# Patient Record
Sex: Female | Born: 1955 | Race: White | Hispanic: No | Marital: Married | Smoking: Never smoker
Health system: Southern US, Community
[De-identification: ages and names within clinical notes are randomized; demographics above are authoritative.]

## PROBLEM LIST (undated history)

## (undated) DIAGNOSIS — K3189 Other diseases of stomach and duodenum: Secondary | ICD-10-CM

## (undated) DIAGNOSIS — Q8901 Asplenia (congenital): Secondary | ICD-10-CM

## (undated) DIAGNOSIS — F32A Depression, unspecified: Secondary | ICD-10-CM

## (undated) DIAGNOSIS — K743 Primary biliary cirrhosis: Secondary | ICD-10-CM

## (undated) DIAGNOSIS — F329 Major depressive disorder, single episode, unspecified: Secondary | ICD-10-CM

## (undated) DIAGNOSIS — R011 Cardiac murmur, unspecified: Secondary | ICD-10-CM

## (undated) DIAGNOSIS — IMO0002 Reserved for concepts with insufficient information to code with codable children: Secondary | ICD-10-CM

## (undated) DIAGNOSIS — M329 Systemic lupus erythematosus, unspecified: Secondary | ICD-10-CM

## (undated) DIAGNOSIS — K766 Portal hypertension: Secondary | ICD-10-CM

## (undated) HISTORY — DX: Asplenia (congenital): Q89.01

## (undated) HISTORY — DX: Reserved for concepts with insufficient information to code with codable children: IMO0002

## (undated) HISTORY — PX: OTHER SURGICAL HISTORY: SHX169

## (undated) HISTORY — DX: Portal hypertension: K76.6

## (undated) HISTORY — DX: Other diseases of stomach and duodenum: K31.89

## (undated) HISTORY — DX: Depression, unspecified: F32.A

## (undated) HISTORY — DX: Systemic lupus erythematosus, unspecified: M32.9

## (undated) HISTORY — DX: Cardiac murmur, unspecified: R01.1

## (undated) HISTORY — DX: Major depressive disorder, single episode, unspecified: F32.9

---

## 2003-01-27 ENCOUNTER — Other Ambulatory Visit: Admission: RE | Admit: 2003-01-27 | Discharge: 2003-01-27 | Payer: Self-pay | Admitting: Obstetrics and Gynecology

## 2003-06-26 ENCOUNTER — Ambulatory Visit (HOSPITAL_COMMUNITY): Admission: RE | Admit: 2003-06-26 | Discharge: 2003-06-26 | Payer: Self-pay | Admitting: Urology

## 2003-06-26 ENCOUNTER — Ambulatory Visit (HOSPITAL_BASED_OUTPATIENT_CLINIC_OR_DEPARTMENT_OTHER): Admission: RE | Admit: 2003-06-26 | Discharge: 2003-06-26 | Payer: Self-pay | Admitting: Urology

## 2003-06-26 ENCOUNTER — Encounter (INDEPENDENT_AMBULATORY_CARE_PROVIDER_SITE_OTHER): Payer: Self-pay | Admitting: Specialist

## 2005-10-20 HISTORY — PX: MANDIBLE SURGERY: SHX707

## 2006-09-01 ENCOUNTER — Ambulatory Visit: Payer: Self-pay | Admitting: Family Medicine

## 2006-09-01 DIAGNOSIS — F3289 Other specified depressive episodes: Secondary | ICD-10-CM | POA: Insufficient documentation

## 2006-09-01 DIAGNOSIS — F329 Major depressive disorder, single episode, unspecified: Secondary | ICD-10-CM

## 2006-09-23 ENCOUNTER — Ambulatory Visit (HOSPITAL_COMMUNITY): Admission: RE | Admit: 2006-09-23 | Discharge: 2006-09-23 | Payer: Self-pay | Admitting: Obstetrics and Gynecology

## 2006-12-24 ENCOUNTER — Telehealth (INDEPENDENT_AMBULATORY_CARE_PROVIDER_SITE_OTHER): Payer: Self-pay | Admitting: *Deleted

## 2007-05-04 ENCOUNTER — Encounter: Payer: Self-pay | Admitting: Family Medicine

## 2008-04-27 ENCOUNTER — Ambulatory Visit: Payer: Self-pay | Admitting: Family Medicine

## 2008-04-27 DIAGNOSIS — J029 Acute pharyngitis, unspecified: Secondary | ICD-10-CM

## 2008-05-01 ENCOUNTER — Encounter (INDEPENDENT_AMBULATORY_CARE_PROVIDER_SITE_OTHER): Payer: Self-pay | Admitting: *Deleted

## 2008-07-31 ENCOUNTER — Ambulatory Visit (HOSPITAL_COMMUNITY): Admission: RE | Admit: 2008-07-31 | Discharge: 2008-07-31 | Payer: Self-pay | Admitting: Obstetrics and Gynecology

## 2008-07-31 ENCOUNTER — Encounter (INDEPENDENT_AMBULATORY_CARE_PROVIDER_SITE_OTHER): Payer: Self-pay | Admitting: Obstetrics and Gynecology

## 2008-10-30 ENCOUNTER — Ambulatory Visit (HOSPITAL_BASED_OUTPATIENT_CLINIC_OR_DEPARTMENT_OTHER): Admission: RE | Admit: 2008-10-30 | Discharge: 2008-10-30 | Payer: Self-pay | Admitting: Obstetrics and Gynecology

## 2008-10-30 ENCOUNTER — Ambulatory Visit: Payer: Self-pay | Admitting: Diagnostic Radiology

## 2008-11-24 ENCOUNTER — Ambulatory Visit: Payer: Self-pay | Admitting: Family Medicine

## 2008-11-24 DIAGNOSIS — R609 Edema, unspecified: Secondary | ICD-10-CM | POA: Insufficient documentation

## 2008-11-24 DIAGNOSIS — R42 Dizziness and giddiness: Secondary | ICD-10-CM | POA: Insufficient documentation

## 2008-11-24 DIAGNOSIS — M2550 Pain in unspecified joint: Secondary | ICD-10-CM | POA: Insufficient documentation

## 2008-12-01 ENCOUNTER — Telehealth (INDEPENDENT_AMBULATORY_CARE_PROVIDER_SITE_OTHER): Payer: Self-pay | Admitting: *Deleted

## 2008-12-08 ENCOUNTER — Encounter: Payer: Self-pay | Admitting: Family Medicine

## 2008-12-18 ENCOUNTER — Encounter: Payer: Self-pay | Admitting: Family Medicine

## 2009-01-01 ENCOUNTER — Encounter: Admission: RE | Admit: 2009-01-01 | Discharge: 2009-01-01 | Payer: Self-pay | Admitting: Rheumatology

## 2009-01-01 ENCOUNTER — Encounter: Payer: Self-pay | Admitting: Family Medicine

## 2009-01-10 ENCOUNTER — Telehealth (INDEPENDENT_AMBULATORY_CARE_PROVIDER_SITE_OTHER): Payer: Self-pay | Admitting: *Deleted

## 2009-01-17 ENCOUNTER — Ambulatory Visit: Payer: Self-pay | Admitting: Family Medicine

## 2009-01-17 ENCOUNTER — Telehealth: Payer: Self-pay | Admitting: Family Medicine

## 2009-01-17 DIAGNOSIS — R740 Nonspecific elevation of levels of transaminase and lactic acid dehydrogenase [LDH]: Secondary | ICD-10-CM

## 2009-01-22 ENCOUNTER — Encounter (INDEPENDENT_AMBULATORY_CARE_PROVIDER_SITE_OTHER): Payer: Self-pay | Admitting: *Deleted

## 2009-01-22 LAB — CONVERTED CEMR LAB
Bilirubin, Direct: 0.6 mg/dL — ABNORMAL HIGH (ref 0.0–0.3)
HCV Ab: NEGATIVE
Hep A IgM: NEGATIVE
Hep B C IgM: NEGATIVE
Indirect Bilirubin: 0.8 mg/dL (ref 0.0–0.9)

## 2009-01-25 ENCOUNTER — Ambulatory Visit: Payer: Self-pay | Admitting: Family Medicine

## 2009-01-25 DIAGNOSIS — R143 Flatulence: Secondary | ICD-10-CM

## 2009-01-25 DIAGNOSIS — R142 Eructation: Secondary | ICD-10-CM

## 2009-01-25 DIAGNOSIS — R141 Gas pain: Secondary | ICD-10-CM

## 2009-01-26 ENCOUNTER — Ambulatory Visit: Payer: Self-pay | Admitting: Family Medicine

## 2009-01-29 ENCOUNTER — Encounter (INDEPENDENT_AMBULATORY_CARE_PROVIDER_SITE_OTHER): Payer: Self-pay | Admitting: *Deleted

## 2009-02-02 ENCOUNTER — Telehealth: Payer: Self-pay | Admitting: Family Medicine

## 2009-02-05 LAB — CONVERTED CEMR LAB
ALT: 22 units/L (ref 0–35)
AST: 39 units/L — ABNORMAL HIGH (ref 0–37)
Alkaline Phosphatase: 171 units/L — ABNORMAL HIGH (ref 39–117)
BUN: 5 mg/dL — ABNORMAL LOW (ref 6–23)
Chloride: 109 meq/L (ref 96–112)
GFR calc non Af Amer: 178.1 mL/min (ref >60)
Glucose, Bld: 77 mg/dL (ref 70–99)
Potassium: 4 meq/L (ref 3.5–5.1)
Sodium: 141 meq/L (ref 135–145)
TSH: 1.47 microintl units/mL (ref 0.35–5.50)
Total Bilirubin: 1.9 mg/dL — ABNORMAL HIGH (ref 0.3–1.2)

## 2009-02-08 ENCOUNTER — Telehealth: Payer: Self-pay | Admitting: Gastroenterology

## 2009-02-08 ENCOUNTER — Ambulatory Visit: Payer: Self-pay | Admitting: Gastroenterology

## 2009-02-08 DIAGNOSIS — N390 Urinary tract infection, site not specified: Secondary | ICD-10-CM | POA: Insufficient documentation

## 2009-02-08 DIAGNOSIS — D649 Anemia, unspecified: Secondary | ICD-10-CM | POA: Insufficient documentation

## 2009-02-08 DIAGNOSIS — K219 Gastro-esophageal reflux disease without esophagitis: Secondary | ICD-10-CM | POA: Insufficient documentation

## 2009-02-08 DIAGNOSIS — F411 Generalized anxiety disorder: Secondary | ICD-10-CM | POA: Insufficient documentation

## 2009-02-08 LAB — CONVERTED CEMR LAB
A-1 Antitrypsin, Ser: 153 mg/dL (ref 83–200)
Ceruloplasmin: 26 mg/dL (ref 21–63)

## 2009-02-09 LAB — CONVERTED CEMR LAB
ALT: 26 units/L (ref 0–35)
Albumin: 2.8 g/dL — ABNORMAL LOW (ref 3.5–5.2)
Alkaline Phosphatase: 186 units/L — ABNORMAL HIGH (ref 39–117)
Ammonia: 11 umol/L (ref 11–35)
Bilirubin, Direct: 0.6 mg/dL — ABNORMAL HIGH (ref 0.0–0.3)
Folate: 11.9 ng/mL
INR: 1.1 — ABNORMAL HIGH (ref 0.8–1.0)
Prothrombin Time: 11.8 s — ABNORMAL HIGH (ref 9.1–11.7)
Saturation Ratios: 30.3 % (ref 20.0–50.0)
Total Protein: 6.5 g/dL (ref 6.0–8.3)

## 2009-02-19 ENCOUNTER — Telehealth: Payer: Self-pay | Admitting: Gastroenterology

## 2009-02-19 ENCOUNTER — Encounter: Admission: RE | Admit: 2009-02-19 | Discharge: 2009-02-19 | Payer: Self-pay | Admitting: Family Medicine

## 2009-02-19 ENCOUNTER — Telehealth (INDEPENDENT_AMBULATORY_CARE_PROVIDER_SITE_OTHER): Payer: Self-pay | Admitting: *Deleted

## 2009-02-23 ENCOUNTER — Ambulatory Visit: Payer: Self-pay | Admitting: Gastroenterology

## 2009-02-26 ENCOUNTER — Telehealth (INDEPENDENT_AMBULATORY_CARE_PROVIDER_SITE_OTHER): Payer: Self-pay | Admitting: *Deleted

## 2009-03-12 ENCOUNTER — Ambulatory Visit (HOSPITAL_COMMUNITY): Admission: RE | Admit: 2009-03-12 | Discharge: 2009-03-12 | Payer: Self-pay | Admitting: Gastroenterology

## 2009-03-12 ENCOUNTER — Encounter: Payer: Self-pay | Admitting: Gastroenterology

## 2009-03-15 ENCOUNTER — Telehealth: Payer: Self-pay | Admitting: Gastroenterology

## 2009-03-16 ENCOUNTER — Telehealth: Payer: Self-pay | Admitting: Gastroenterology

## 2009-03-19 ENCOUNTER — Telehealth: Payer: Self-pay | Admitting: Gastroenterology

## 2009-03-20 ENCOUNTER — Telehealth: Payer: Self-pay | Admitting: Family Medicine

## 2009-03-20 DIAGNOSIS — R404 Transient alteration of awareness: Secondary | ICD-10-CM

## 2009-03-21 ENCOUNTER — Encounter (INDEPENDENT_AMBULATORY_CARE_PROVIDER_SITE_OTHER): Payer: Self-pay | Admitting: *Deleted

## 2009-03-23 ENCOUNTER — Ambulatory Visit: Payer: Self-pay | Admitting: Gastroenterology

## 2009-03-23 DIAGNOSIS — K738 Other chronic hepatitis, not elsewhere classified: Secondary | ICD-10-CM

## 2009-03-23 LAB — CONVERTED CEMR LAB
Alpha-1-Globulin: 4.1 % (ref 2.9–4.9)
Alpha-2-Globulin: 6 % — ABNORMAL LOW (ref 7.1–11.8)
Beta Globulin: 6.4 % (ref 4.7–7.2)
Gamma Globulin: 28.2 % — ABNORMAL HIGH (ref 11.1–18.8)

## 2009-03-27 ENCOUNTER — Telehealth: Payer: Self-pay | Admitting: Gastroenterology

## 2009-03-27 LAB — CONVERTED CEMR LAB
ALT: 35 units/L (ref 0–35)
Albumin: 2.9 g/dL — ABNORMAL LOW (ref 3.5–5.2)
Ammonia: 25 umol/L (ref 11–35)
INR: 1.1 — ABNORMAL HIGH (ref 0.8–1.0)
Prothrombin Time: 11.7 s (ref 9.1–11.7)
Total Protein: 6.5 g/dL (ref 6.0–8.3)

## 2009-05-14 ENCOUNTER — Encounter: Payer: Self-pay | Admitting: Family Medicine

## 2009-05-15 ENCOUNTER — Telehealth: Payer: Self-pay | Admitting: Gastroenterology

## 2009-06-25 ENCOUNTER — Encounter: Payer: Self-pay | Admitting: Family Medicine

## 2009-07-27 ENCOUNTER — Encounter: Payer: Self-pay | Admitting: Family Medicine

## 2009-08-01 ENCOUNTER — Ambulatory Visit: Payer: Self-pay | Admitting: Family Medicine

## 2009-08-27 ENCOUNTER — Ambulatory Visit: Payer: Self-pay | Admitting: Family Medicine

## 2009-08-27 DIAGNOSIS — L259 Unspecified contact dermatitis, unspecified cause: Secondary | ICD-10-CM | POA: Insufficient documentation

## 2009-08-27 DIAGNOSIS — Q8909 Congenital malformations of spleen: Secondary | ICD-10-CM

## 2009-11-02 ENCOUNTER — Ambulatory Visit: Payer: Self-pay | Admitting: Family Medicine

## 2009-11-02 DIAGNOSIS — R21 Rash and other nonspecific skin eruption: Secondary | ICD-10-CM

## 2009-11-06 ENCOUNTER — Encounter: Payer: Self-pay | Admitting: Family Medicine

## 2009-11-09 ENCOUNTER — Telehealth: Payer: Self-pay | Admitting: Gastroenterology

## 2009-11-21 ENCOUNTER — Ambulatory Visit (HOSPITAL_BASED_OUTPATIENT_CLINIC_OR_DEPARTMENT_OTHER): Admission: RE | Admit: 2009-11-21 | Discharge: 2009-11-21 | Payer: Self-pay | Admitting: Obstetrics and Gynecology

## 2009-11-21 ENCOUNTER — Ambulatory Visit: Payer: Self-pay | Admitting: Diagnostic Radiology

## 2009-12-07 ENCOUNTER — Encounter: Payer: Self-pay | Admitting: Gastroenterology

## 2009-12-24 ENCOUNTER — Ambulatory Visit: Payer: Self-pay | Admitting: Family Medicine

## 2010-01-15 ENCOUNTER — Telehealth: Payer: Self-pay | Admitting: Gastroenterology

## 2010-01-18 ENCOUNTER — Telehealth (INDEPENDENT_AMBULATORY_CARE_PROVIDER_SITE_OTHER): Payer: Self-pay | Admitting: *Deleted

## 2010-01-18 ENCOUNTER — Telehealth: Payer: Self-pay | Admitting: Internal Medicine

## 2010-02-10 ENCOUNTER — Encounter: Payer: Self-pay | Admitting: Obstetrics and Gynecology

## 2010-02-17 LAB — CONVERTED CEMR LAB
ALT: 25 units/L (ref 0–35)
ALT: 35 units/L (ref 0–35)
AST: 42 units/L — ABNORMAL HIGH (ref 0–37)
Albumin: 3 g/dL — ABNORMAL LOW (ref 3.5–5.2)
BUN: 5 mg/dL — ABNORMAL LOW (ref 6–23)
BUN: 6 mg/dL (ref 6–23)
Basophils Relative: 0.4 % (ref 0.0–3.0)
Bilirubin, Direct: 0.1 mg/dL (ref 0.0–0.3)
Calcium: 8.8 mg/dL (ref 8.4–10.5)
Chloride: 106 meq/L (ref 96–112)
Cholesterol: 159 mg/dL (ref 0–200)
Eosinophils Absolute: 0.2 10*3/uL (ref 0.0–0.6)
Eosinophils Relative: 2.9 % (ref 0.0–5.0)
GFR calc Af Amer: 114 mL/min
GFR calc non Af Amer: 95 mL/min
Glucose, Bld: 90 mg/dL (ref 70–99)
HCT: 33.2 % — ABNORMAL LOW (ref 36.0–46.0)
HDL: 79.8 mg/dL (ref >39.0)
Hemoglobin: 11.3 g/dL — ABNORMAL LOW (ref 12.0–15.0)
Lymphocytes Relative: 35.6 % (ref 12.0–46.0)
Lymphs Abs: 4.7 10*3/uL — ABNORMAL HIGH (ref 0.7–4.0)
MCV: 94.5 fL (ref 78.0–100.0)
MCV: 98.8 fL (ref 78.0–100.0)
Monocytes Absolute: 1.4 10*3/uL — ABNORMAL HIGH (ref 0.1–1.0)
Monocytes Relative: 14.8 % — ABNORMAL HIGH (ref 3.0–12.0)
Monocytes Relative: 9.6 % (ref 3.0–11.0)
Neutro Abs: 2.8 10*3/uL (ref 1.4–7.7)
Neutro Abs: 4.4 10*3/uL (ref 1.4–7.7)
Pap Smear: NORMAL
Platelets: 138 10*3/uL — ABNORMAL LOW (ref 150.0–400.0)
Platelets: 368 10*3/uL (ref 150–400)
Potassium: 4.2 meq/L (ref 3.5–5.1)
Potassium: 4.5 meq/L (ref 3.5–5.1)
RBC: 3.36 M/uL — ABNORMAL LOW (ref 3.87–5.11)
Rhuematoid fact SerPl-aCnc: <20 intl units/mL (ref 0.0–20.0)
Sodium: 143 meq/L (ref 135–145)
TSH: 1.06 microintl units/mL (ref 0.35–5.50)
Total Protein: 6.7 g/dL (ref 6.0–8.3)
Triglycerides: 61 mg/dL (ref 0–149)
WBC: 9.2 10*3/uL (ref 4.5–10.5)

## 2010-02-19 NOTE — Letter (Signed)
Summary: Hep A & B Vaccine Orders/[facility]  Hep A & B Vaccine Orders/[facility]   Imported By: Lanelle Bal 08/08/2009 11:05:29  _____________________________________________________________________  External Attachment:    Type:   Image     Comment:   External Document

## 2010-02-19 NOTE — Progress Notes (Signed)
Summary: Received Medical Release  Received a completed Medical release for Dr. Loreta Ave to receive records. [employer] faxed and mailed 15 pages. Dena Chavis  February 26, 2009 2:54 PM

## 2010-02-19 NOTE — Progress Notes (Signed)
Summary: Needs a new prescription  Phone Note Call from Patient Call back at Home Phone [phone]   Call For: Dr Jarold Motto Summary of Call: Prescription wasa changed from 3 to 6 pills per day for Aldactone. Is all out of her meds. [employer]. Initial call taken by: Leanor Kail [facility],  November 09, 2009 9:13 AM  Follow-up for Phone Call        rx sent  Follow-up by: Harlow Mares CMA [facility]),  November 09, 2009 9:42 AM    Prescriptions: ALDACTONE 25 MG TABS (SPIRONOLACTONE) 2 tabs by mouth three times a day  #180 x 1   Entered by:   Harlow Mares CMA (AAMA)   Authorized by:   Mardella Layman MD [facility]   Signed by:   Harlow Mares CMA (AAMA) on 11/09/2009   Method used:   Electronically to        [facility]* (retail)       [address]       [place], [place]  [address]       Ph: [phone]       Fax: [phone]   RxID:   [id number]

## 2010-02-19 NOTE — Progress Notes (Signed)
Summary: fyi fyi  leg swelling  Phone Note Call from Patient Call back at Home Phone [phone]   Caller: Patient Summary of Call: pt left VM that she would like results of  recent labs.called pt back advise pt labs normal other labs pending. pt states that she is still worried about the swelling in her leg. pt states that she spoke with a co-worker who had swelling in her leg that turn out to be a clot, so pt is now very concern about this matter. However pt main concern was that it was not what was initially stated at OV because it is no better. pt denies any tenderness to touch or warmness. dr Laury Axon pls advise...............Marland KitchenFelecia Deloach CMA  February 02, 2009 3:57 PM   Follow-up for Phone Call        If she is concerned about a clot she should go to ER--- labs have not changed much we will  make sure GI gets them.   Is swelling worse than when she was here last?   Follow-up by: Loreen Freud DO,  February 02, 2009 5:22 PM  Additional Follow-up for Phone Call Additional follow up Details #1::        left pt detail message if she believe it is a clot she needs to be seen in ED. will f/u with pt on monday..................Marland KitchenFelecia Deloach CMA  February 02, 2009 5:25 PM     Additional Follow-up for Phone Call Additional follow up Details #2::    pt aware, decline ED pt states that she just wanted to know what other options she has to rule out a clot. pt states that she would prefer to have a ultrasound done first to make sure it is not a clot. pt denies any change in swelling since last OV, pain or warmness to touch. pt states that if there is any change in legs she will go to ED..................Marland KitchenFelecia Deloach CMA  February 05, 2009 8:20 AM   Additional Follow-up for Phone Call Additional follow up Details #3:: Details for Additional Follow-up Action Taken: Usually if no pain --there is no clot---US ordered but it she develops any reddness or pain go to ER Does she still have abd  bloating?  If yes---also get pelvic US  Pt states that she is having abdomen bloating. She agreed to go to the ER if she was having any redness or pain. Army Fossa CMA  February 05, 2009 9:47 AM Additional Follow-up by: Loreen Freud DO,  February 05, 2009 8:52 AM

## 2010-02-19 NOTE — Assessment & Plan Note (Signed)
Summary: rash on chest again/kn   Vital Signs:  Patient profile:   55 year old female Height:      64 inches (162.56 cm) Weight:      147.25 pounds (66.93 kg) BMI:     25.37 Temp:     98.2 degrees F (36.78 degrees C) oral BP sitting:   120 / 62  (left arm) Cuff size:   regular  Vitals Entered By: Lucious Groves CMA (November 02, 2009 3:00 PM) CC: C/O recurrent rash on chest./kb Is Patient Diabetic? No Pain Assessment Patient in pain? no      Comments Patient notes that it is not painful, nor has she had fever or any lifestyle/diet changes. Patient continues to itch and scratch which is turning rash areas into sores./kb   History of Present Illness: Pt here c/o rash is back---see last visit.   + itchy.  Current Medications (verified): 1)  Aldactone 25 Mg Tabs (Spironolactone) .... 2 Tabs By Mouth Three Times A Day 2)  Dextroamphetamine Sulfate 10 Mg Tabs (Dextroamphetamine Sulfate) .... Take 2 Tabs in Morning and Noon 3)  Diprolene 0.05 % Lotn (Betamethasone Dipropionate Aug) .... Apply Bid  Allergies (verified): 1)  ! Pcn  Past History:  Past medical, surgical, family and social histories (including risk factors) reviewed for relevance to current acute and chronic problems.  Past Medical History: Reviewed history from 09/01/2006 and no changes required. Depression  Past Surgical History: Reviewed history from 09/01/2006 and no changes required. UPPER/LOWER JAW REPAIR  10/07  Family History: Reviewed history from 02/23/2009 and no changes required. MOTHER:LIVING FATHER:DECEASED 3 BROTHERS:LIVING Family History Hypertension: M, F HEART: FATHER'S SIDE OF FAMILY Family History Diabetes 1st degree relative: BRAIN CANCER: MGF OTHER CANCER:PGF Family History Depression:BROTHER Family History of Ovarian Cancer:MGM Family History of Stomach Cancer:PGM  Family History of Uterine Cancer:Mother No FH of Colon Cancer:  Social History: Reviewed history from 02/08/2009  and no changes required. 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     Impression & Recommendations:  Problem # 1:  SKIN RASH (ICD-782.1)  ? psoriasis Her updated medication list for this problem includes:    Diprolene 0.05 % Lotn (Betamethasone dipropionate aug) .Marland Kitchen... Apply bid  Orders: Dermatology Referral [facility])  Discussed medication use and symptom control.   Complete Medication List: 1)  Aldactone 25 Mg Tabs (Spironolactone) .... 2 tabs by mouth three times a day 2)  Dextroamphetamine Sulfate 10 Mg Tabs (Dextroamphetamine sulfate) .... Take 2 tabs in morning and noon 3)  Diprolene 0.05 % Lotn (Betamethasone dipropionate aug) .... Apply bid Prescriptions: DIPROLENE 0.05 % LOTN (BETAMETHASONE DIPROPIONATE AUG) apply bid  #30 g x 0   Entered and Authorized by:   Loreen Freud DO   Signed by:   Loreen Freud DO on 11/02/2009   Method used:   Electronically to        [facility]* (retail)       [address]       [place], [place]  [address]       Ph: [phone]       Fax: [phone]  RxID:   [id number]

## 2010-02-19 NOTE — Assessment & Plan Note (Signed)
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 She previously had a very weakly positive ANA but all other serologic markers have been negative. She denies any symptoms of collagen vascular disease.  She is severely depressed has been off of her Celexa and Abilify or herpetic workup. Her husband is with her who is very critical of all physicians. They're convinced that she needs cardiology referral because of her edema which I think is related to her low albumin levels. Surprisingly, liver biopsy showed no evidence of cirrhosis or active inflammation. She denies any other mental status or neurological problems. She denies any cardiovascular or pulmonary complaints, bleeding problems, other gastrointestinal issues, fever chills or other systemic complaints.   GI Review of Systems    Reports nausea.      Denies abdominal pain, acid reflux, belching, bloating, chest pain, dysphagia with liquids, dysphagia with solids, heartburn, loss of appetite, vomiting, vomiting blood, weight loss, and  weight gain.      Reports liver problems.     Denies anal fissure, black tarry stools, change in bowel habit, constipation, diarrhea, diverticulosis, fecal incontinence, heme positive stool, hemorrhoids, irritable bowel syndrome, jaundice, light color stool, rectal bleeding, and  rectal pain.    Current Medications (verified): 1)  Aldactone 25 Mg Tabs (Spironolactone) .Marland Kitchen.. 1 By Mouth Tid 2)  Aciphex 20 Mg Tbec (Rabeprazole Sodium) .Marland Kitchen.. 1 By Mouth Qd  Allergies (verified): 1)  ! Pcn  Past History:  Past medical, surgical, family  and social histories (including risk factors) reviewed for relevance to current acute and chronic problems.  Past Medical History: Reviewed history from 09/01/2006 and no changes required. Depression  Past Surgical History: Reviewed history from 09/01/2006 and no changes required. UPPER/LOWER JAW REPAIR  10/07  Family History: Reviewed history from 02/23/2009 and no changes required. MOTHER:LIVING FATHER:DECEASED 3 BROTHERS:LIVING Family History Hypertension: M, F HEART: FATHER'S SIDE OF FAMILY Family History Diabetes 1st degree relative: BRAIN CANCER: MGF OTHER CANCER:PGF Family History Depression:BROTHER Family History of Ovarian Cancer:MGM Family History of Stomach Cancer:PGM  Family History of Uterine Cancer:Mother No FH of Colon Cancer:  Social History: Reviewed history from 02/08/2009 and no changes required. 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  The patient denies allergy/sinus, anemia, anxiety-new, arthritis/joint pain, back pain, blood in urine, breast changes/lumps, change in vision, confusion, cough, coughing up blood, fainting, fever, headaches-new, hearing problems, heart murmur, heart rhythm changes, itching, menstrual pain, muscle pains/cramps, night sweats, nosebleeds, pregnancy symptoms, shortness of breath, skin rash, sore throat, swollen lymph glands, thirst - excessive , urination - excessive , urination changes/pain, urine leakage, vision changes, and voice change.         She complains of worsening edema especially at night but no shortness of breath, dyspnea, chest pain, et Karie Soda. She has had thorough gynecologic workup has been no evidence of malignancy or gynecologic problems.  Vital Signs:  Patient profile:   55 year old  female Height:  64 inches Weight:      151.50 pounds BMI:     26.10 Pulse rate:   70 / minute Pulse rhythm:   regular BP sitting:   100 / 50  (left arm)  Vitals Entered By: Chales Abrahams CMA [facility]) (March 23, 2009 8:45 AM)  Physical Exam  General:  Well developed, well nourished, no acute distress.healthy appearing.   Head:  Normocephalic and atraumatic. Eyes:  PERRLA, no icterus.exam deferred to patient's ophthalmologist.   Abdomen:  Soft, nontender and nondistended. No masses, hepatosplenomegaly or hernias noted. Normal bowel sounds. Extremities:  1+ pedal edema.   Neurologic:  Alert and  oriented x4;  grossly normal neurologically. Psych:  depressed affect.     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 I will check serum protein electrophoresis, repeat liver profile, ammonia level, and prothrombin time. Cause of her edema and increased her Aldactone to 50 mg t.i.d. Her husband has a variety of vitamin supplements and" boosters" that I have asked him to discontinue since some of these do contain excessive amounts of vitamin A. I've suggested that she take a Centrum Silver multivitamin daily. She allegedly has stopped using alcohol altogether, and her previous CDT level was borderline elevated. I probably will refer her to Dr.Jarma Piedad Climes at [facility] hepatology Department for second opinion. There seems to be a disconnect here between her symptomatology and her hepatic findings. Orders: TLB-Hepatic/Liver Function Pnl (80076-HEPATIC) TLB-Ammonia ([id number]) TLB-PT (Protime) (85610-PTP) T-ANA ([id number]) T-Serum Protein Electrophoresis ([id number])  Problem # 2:  SOMNOLENCE  (ICD-780.09) Assessment: Unchanged This to me seems to be most related to her depression which has appreciably worsened in the last several months off of medication.  Patient Instructions: 1)  Go to the basement for lab work. 2)  Increase your Aldactone to 2 tabs three times a day. 3)  The medication list was reviewed and reconciled.  All changed / newly prescribed medications were explained.  A complete medication list was provided to the patient / caregiver. 4)  Call Dr. Evelene Croon to resume antidepressant medications. 5)  Copy sent to : Dr. Amedeo Gory and Dr. Evelene Croon in psychiatry

## 2010-02-19 NOTE — Progress Notes (Signed)
Summary: Question for nurse  Phone Note Call from Patient Call back at Home Phone [phone]   Call For: DR PATTERSON Summary of Call: Was here today for office visit and has a question for nurse. Initial call taken by: Leanor Kail [facility],  February 08, 2009 10:41 AM  Follow-up for Phone Call        Left message for pt to call back. Ashok Cordia RN  February 08, 2009 10:47 AM  talked with pt.  She asks what condition Dr. Jarold Motto mentioned that could be her problem.   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 Follow-up by: Ashok Cordia RN,  February 08, 2009 1:05 PM

## 2010-02-19 NOTE — Progress Notes (Signed)
Summary: Wants labs faxed  Phone Note Call from Patient Call back at [phone]   Call For: Dr Jarold Motto Summary of Call: [phone] FAX# would like all her labs done so far. Initial call taken by: Leanor Kail [facility],  March 19, 2009 8:55 AM  Follow-up for Phone Call        Talked with pt.  Copies of labs sent to pt.  Follow-up by: Ashok Cordia RN,  March 19, 2009 9:14 AM

## 2010-02-19 NOTE — Consult Note (Signed)
Summary: [facility]   Imported By: Lanelle Bal 12/19/2009 13:07:23  _____________________________________________________________________  External Attachment:    Type:   Image     Comment:   External Document

## 2010-02-19 NOTE — Progress Notes (Signed)
Summary: Triage  Phone Note Call from Patient Call back at Home Phone [phone]   Caller: Patient Call For: Dr. Jarold Motto Reason for Call: Talk to Nurse Summary of Call: Pt is using Gelniquie and she thinks the problems she is having are coming from the side effects of this medication Initial call taken by: Karna Christmas,  February 19, 2009 12:27 PM  Follow-up for Phone Call        Pt forgot to tell Dr. Jarold Motto at last OV that she has been using Gelnique, which is Oxybutynin in a jell form that is used for overactive bladder.  Pt read that the side effects are elevated liver enzyme and swelling.  She has not used this in 2 weeks.  Meds given at OV have helped.  Just wanted Dr. Jarold Motto to know.  Follow-up by: Ashok Cordia RN,  February 19, 2009 12:49 PM  Additional Follow-up for Phone Call Additional follow up Details #1::        ok Additional Follow-up by: Mardella Layman MD [facility],  February 19, 2009 1:14 PM

## 2010-02-19 NOTE — Letter (Signed)
Summary: Primary Care Consult Scheduled Letter  [facility] [place]  [address] [place], [place] [address]   Phone: [phone]  Fax: [phone]      03/21/2009 MRN: [id number]  Sara Grimes [address] [place], [place]  [address]    Dear Ms. [place],    We have scheduled an appointment for you.  At the recommendation of Dr. Loreen Freud, we have scheduled you a Sleep Consult with Dr. Vassie Loll with [facility] on 04-10-2009 at 1:30pm.  Their address is [address] [place], [place] [address]. The office phone number is [phone].  If this appointment day and time is not convenient for you, please feel free to call the office of the doctor you are being referred to at the number listed above and reschedule the appointment.    It is important for you to keep your scheduled appointments. We are here to make sure you are given good patient care.   Thank you,    Renee, Patient Care Coordinator [facility]

## 2010-02-19 NOTE — Assessment & Plan Note (Signed)
Summary: 2wk fu./yesi   History of Present Illness Visit Type: follow up  Primary GI MD: Sheryn Bison MD FACP FAGA Primary Provider: Loreen Freud, DO  Requesting Provider: n/a Chief Complaint: F/u for severe swelling. Pt states that the medication is working well and denies any GI complaints History of Present Illness:   Sara Grimes is asymptomatic in terms of any GI complaints except for chronic fatigue. Her husband reiterates this is been a problem for several months. All of her liver workup parameters have been negative to date. Also extensive GYN evaluation has been negative for any ovarian malignancy. See CDT level was borderline but not elevated. She is on AcipHex 20 mg a day and Aldactone 25 mg t.i.d. She denies any neuropsychiatric problems, fever, chills, or systemic complaints. Her appetite is good weight is stable.   GI Review of Systems      Denies abdominal pain, acid reflux, belching, bloating, chest pain, dysphagia with liquids, dysphagia with solids, heartburn, loss of appetite, nausea, vomiting, vomiting blood, weight loss, and  weight gain.        Denies anal fissure, black tarry stools, change in bowel habit, constipation, diarrhea, diverticulosis, fecal incontinence, heme positive stool, hemorrhoids, irritable bowel syndrome, jaundice, light color stool, liver problems, rectal bleeding, and  rectal pain.    Current Medications (verified): 1)  Aldactone 25 Mg Tabs (Spironolactone) .Marland Kitchen.. 1 By Mouth Tid 2)  Aciphex 20 Mg Tbec (Rabeprazole Sodium) .Marland Kitchen.. 1 By Mouth Qd  Allergies (verified): 1)  ! Pcn  Past History:  Past medical, surgical, family and social histories (including risk factors) reviewed for relevance to current acute and chronic problems.  Past Medical History: Reviewed history from 09/01/2006 and no changes required. Depression  Past Surgical History: Reviewed history from 09/01/2006 and no changes required. UPPER/LOWER JAW REPAIR  10/07  Family  History: Reviewed history from 02/08/2009 and no changes required. MOTHER:LIVING FATHER:DECEASED 3 BROTHERS:LIVING Family History Hypertension: M, F HEART: FATHER'S SIDE OF FAMILY Family History Diabetes 1st degree relative: BRAIN CANCER: MGF OTHER CANCER:PGF Family History Depression:BROTHER Family History of Ovarian Cancer:MGM Family History of Stomach Cancer:PGM  Family History of Uterine Cancer:Mother No FH of Colon Cancer:  Social History: Reviewed history from 02/08/2009 and no changes required. 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  The patient denies allergy/sinus, anemia, anxiety-new, arthritis/joint pain, back pain, blood in urine, breast changes/lumps, change in vision, confusion, cough, coughing up blood, depression-new, fainting, fever, headaches-new, hearing problems, heart murmur, heart rhythm changes, itching, menstrual pain, muscle pains/cramps, night sweats, nosebleeds, pregnancy symptoms, shortness of breath, skin rash, sleeping problems, sore throat, swelling of feet/legs, swollen lymph glands, thirst - excessive , urination - excessive , urination changes/pain, urine leakage, vision changes, and voice change.    Vital Signs:  Patient profile:   55 year old female Height:      64 inches Weight:      153 pounds BMI:     26.36 BSA:     1.75 Pulse rate:   72 / minute Pulse rhythm:   regular BP sitting:   122 / 68  (left arm) Cuff size:   regular  Vitals Entered By: Ok Anis CMA (February 23, 2009 11:17 AM)  Physical Exam  General:  Well developed, well nourished, no acute distress.healthy appearing.   Head:  Normocephalic and atraumatic. Eyes:  PERRLA, no icterus. Psych:  Alert and cooperative. Normal mood and affect.   Impression & Recommendations:  Problem # 1:   TRANSAMINASES, SERUM, ELEVATED (ICD-790.4) Assessment Improved  Her liver enzymes have improved but her alkaline phosphatase and bilirubin remain mildly elevated. There is no definitive diagnoses determined per her suspected chronic liver disease, and ultrasound-guided liver biopsy has been scheduled with avoidance of salicylates and NSAIDs at least one week before this procedure. Further workup and therapy will depend on her liver biopsy results. For now we'll continue all medications as listed above.  Orders: CT/ULS Guided Liver Biospy (CT/ULS Guided Liv BX)  Patient Instructions: 1)  Copy sent to : Dr. Loreen Freud 2)  Please continue current medications.  3)  Liver Biopsy handout given.  4)  [facility] radiology will contact you with date and instructions re liver biopsy. 5)  You will need to stop all aspirin and ansaids 1 week prior to the biopsy. 6)  The medication list was reviewed and reconciled.  All changed / newly prescribed medications were explained.  A complete medication list was provided to the patient / caregiver.

## 2010-02-19 NOTE — Consult Note (Signed)
Summary: [facility]   Imported By: Lennie Odor 12/17/2009 13:18:00  _____________________________________________________________________  External Attachment:    Type:   Image     Comment:   External Document

## 2010-02-19 NOTE — Progress Notes (Signed)
Summary: refer cardiology  Phone Note Call from Patient Call back at Work Phone [phone] Call back at [phone]   Caller: Patient Complaint: Cough/Sore throat Summary of Call: Pt c/o severe fatigue, falling  to sleep at work and behind the wheel, and headache daily. pt stats that she feel all these symptoms are due to a lack of Oxygen. pt states that liver biopsy only showed inflammation. pt denies any chest pain, pressure, numbness, or tingling. pt would like to be referred to cardiologist. pt states that she had previously mention these symptoms to dr Laury Axon before.pls advise...........Marland KitchenFelecia Deloach CMA  March 20, 2009 2:48 PM   Follow-up for Phone Call        based on these symptoms it sounds like she need pulmonary/ sleep  not cardiology----  they do the testing to check for sleepiness during daytime.  I'll be happy to do that. Follow-up by: Loreen Freud DO,  March 20, 2009 2:52 PM  Additional Follow-up for Phone Call Additional follow up Details #1::        pt aware,awaiting appt info............Marland KitchenFelecia Deloach CMA  March 20, 2009 3:40 PM   New Problems: SOMNOLENCE (ICD-780.09)   New Problems: SOMNOLENCE (ICD-780.09)

## 2010-02-19 NOTE — Assessment & Plan Note (Signed)
Summary: HEP A/B VACC/KB  Nurse Visit   Allergies: 1)  ! Pcn  Immunizations Administered:  TwinRix # 3:    Vaccine Type: TwinRix    Site: right deltoid    Mfr: GlaxoSmithKline    Dose: 1.0 ml    Route: IM    Given by: Jeremy Johann CMA    Exp. Date: 10/27/2011    Lot #: [id number]    VIS given: 10/08/06 version given December 24, 2009.  Orders Added: 1)  TwinRix 1ml ( Hep A&B Adult dose) [90636] 2)  Admin 1st Vaccine [90471]

## 2010-02-19 NOTE — Progress Notes (Signed)
Summary: Wants labs  Phone Note Call from Patient Call back at Work Phone [phone]   Call For: Dr Jarold Motto Reason for Call: Talk to Nurse Summary of Call: Can you please fax the most recent labs to fax # [phone]. Was told to wait 2wks for [facility] to see her- wonders what is the delay. Initial call taken by: Leanor Kail [facility],  March 27, 2009 10:34 AM  Follow-up for Phone Call        Records have been faxed to [facility].  LM for pt to call. Ashok Cordia RN  March 28, 2009 9:39 AM  Spoke with pt.  She request that a copy of her last labs be faxed to her at the number given.  This has been done.  Follow-up by: Ashok Cordia RN,  March 28, 2009 10:49 AM

## 2010-02-19 NOTE — Assessment & Plan Note (Signed)
Summary: OVERALL SWELLING HAS NOT DECREASED///SPH   Vital Signs:  Patient profile:   55 year old female Height:      64 inches Weight:      166.44 pounds BMI:     28.67 Temp:     98.4 degrees F oral Pulse rate:   76 / minute Pulse rhythm:   regular BP sitting:   124 / 80  (left arm) Cuff size:   regular  Vitals Entered By: Army Fossa CMA (January 25, 2009 3:32 PM) CC: Overall swelling is not decreasing seeing GI on Jan 20th.    History of Present Illness: Pt here to f/u swelling in legs.  It no longer comes and goes it is now all the time and she feels bloating in abd as well.    Current Medications (verified): 1)  Alprazolam 0.5 Mg  Tabs (Alprazolam) .... As Needed 2)  Abilify 15 Mg  Tabs (Aripiprazole) .Marland Kitchen.. 1 Once Daily 3)  Cymbalta 60 Mg  Cpep (Duloxetine Hcl) .Marland Kitchen.. 1 Once Daily 4)  Furosemide 20 Mg Tabs (Furosemide) .Marland Kitchen.. 1 By Mouth Once Daily  Allergies: 1)  ! Pcn  Past History:  Past medical, surgical, family and social histories (including risk factors) reviewed for relevance to current acute and chronic problems.  Past Medical History: Reviewed history from 09/01/2006 and no changes required. Depression  Past Surgical History: Reviewed history from 09/01/2006 and no changes required. UPPER/LOWER JAW REPAIR  10/07  Family History: Reviewed history from 09/01/2006 and no changes required. MOTHER:LIVING FATHER:DECEASED 3 BROTHERS:LIVING Family History Hypertension: M, F HEART: FATHER'S SIDE OF FAMILY  Family History Diabetes 1st degree relative:M BRAIN CANCER: MGF OTHER CANCER:PGF  Family History Depression:BROTHER  Social History: Reviewed history from 09/01/2006 and no changes required. 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 Extremities:  2+ left pedal edema, left pretibial edema, 2+ right pedal edema, and right pretibial edema.   Neurologic:  strength normal in all extremities.   Skin:  Intact without suspicious lesions or rashes Cervical Nodes:  No lymphadenopathy noted Psych:  Cognition and judgment appear intact. Alert and cooperative with normal attention span and concentration. No apparent delusions, illusions, hallucinations   Impression & Recommendations:  Problem # 1:  PAIN IN JOINT, MULTIPLE SITES (ICD-719.49)  Problem # 2:  EDEMA (ICD-782.3)  Her updated medication list for this problem includes:    Furosemide 20 Mg Tabs (Furosemide) .Marland Kitchen... 1 by mouth once daily  Orders: Venipuncture (16109) TLB-BMP (Basic Metabolic Panel-BMET) (80048-METABOL) TLB-Hepatic/Liver Function Pnl (80076-HEPATIC) TLB-TSH (Thyroid Stimulating Hormone) (84443-TSH)  Problem # 3:  TRANSAMINASES, SERUM, ELEVATED (ICD-790.4)  Orders: Venipuncture (60454) TLB-BMP (Basic Metabolic Panel-BMET) (80048-METABOL) TLB-Hepatic/Liver Function Pnl (80076-HEPATIC) TLB-TSH (Thyroid Stimulating Hormone) (84443-TSH) TLB-BNP (B-Natriuretic Peptide) (83880-BNPR)  Problem # 4:  ABDOMINAL BLOATING (ICD-787.3) check labs consider   Complete Medication List: 1)  Alprazolam 0.5 Mg Tabs (Alprazolam) .... As needed 2)  Abilify 15 Mg Tabs (Aripiprazole) .Marland Kitchen.. 1 once daily 3)  Cymbalta 60 Mg Cpep (Duloxetine hcl) .Marland Kitchen.. 1 once daily 4)  Furosemide 20 Mg Tabs (Furosemide) .Marland Kitchen.. 1 by mouth once daily

## 2010-02-19 NOTE — Letter (Signed)
Summary: New Patient letter  [facility]  [address] [place], [place] [address]   Phone: [phone]  Fax: [phone]       01/22/2009 MRN: [id number]  SUMIRE HALBLEIB [address] [facility] [place], [place]  [address]  Dear Ms. Kaneshiro,  Welcome to the Gastroenterology Division at [facility].    You are scheduled to see Dr.  Jarold Motto on Jan. 20, 2011   at 8:30am on the 3rd floor at [facility], [address]  We ask that you try to arrive at our office 15 minutes prior to your appointment time to allow for check-in.  We would like you to complete the enclosed self-administered evaluation form prior to your visit and bring it with you on the day of your appointment.  We will review it with you.  Also, please bring a complete list of all your medications or, if you prefer, bring the medication bottles and we will list them.  Please bring your insurance card so that we may make a copy of it.  If your insurance requires a referral to see a specialist, please bring your referral form from your primary care physician.  Co-payments are due at the time of your visit and may be paid by cash, check or credit card.     Your office visit will consist of a consult with your physician (includes a physical exam), any laboratory testing he/she may order, scheduling of any necessary diagnostic testing (e.g. x-ray, ultrasound, CT-scan), and scheduling of a procedure (e.g. Endoscopy, Colonoscopy) if required.  Please allow enough time on your schedule to allow for any/all of these possibilities.    If you cannot keep your appointment, please call [phone] to cancel or reschedule prior to your appointment date.  This allows [place] the opportunity to schedule an appointment for another patient in need of care.  If you do not cancel or reschedule by 5 p.m. the business day prior to your appointment date, you will be charged a $50.00 late cancellation/no-show fee.    Thank you for  choosing [facility] for your medical needs.  We appreciate the opportunity to care for you.  Please visit [place] at our website  to learn more about our practice.                     Sincerely,                                                             The Gastroenterology Division

## 2010-02-19 NOTE — Assessment & Plan Note (Signed)
Summary: rash on chest/cbs   Vital Signs:  Patient profile:   55 year old female Height:      64 inches Weight:      147 pounds Temp:     98.3 degrees F oral Pulse rate:   72 / minute BP sitting:   118 / 70  (left arm)  Vitals Entered By: Jeremy Johann CMA (August 27, 2009 8:21 AM) CC: rash on chest, Rash   History of Present Illness:  Rash      This is a 55 year old woman who presents with Rash.  The symptoms began 1 week ago.  The patient complains of macules and papules, but denies nodules, hives, welts, pustules, blisters, ulcers, itching, scaling, weeping, oozing, redness, increased warmth, and tenderness.  The rash is located on the chest.  The rash is worse with heat and better with cold.  The patient reports a history of new topical exposure.  The patient denies history of recent tick bite, recent tick exposure, other insect bite, recent infection, recent antibiotic use, new medication, new clothing, recent travel, pet/animal contact, thyroid disease, chronic liver disease, autoimmune disease, chronic edema, and prior STD.    Current Medications (verified): 1)  Aldactone 25 Mg Tabs (Spironolactone) .... 2 Tabs By Mouth Three Times A Day 2)  Dextroamphetamine Sulfate 10 Mg Tabs (Dextroamphetamine Sulfate) .... Take 2 Tabs in Morning and Noon 3)  Prednisone 20 Mg Tabs (Prednisone) .... 3 By Mouth Once Daily For 3 Days Then 2 A Day For 3 Days Then 1 A Day For 3 Days Then 1/2 Tab For 3 Days  Allergies (verified): 1)  ! Pcn  Past History:  Past Medical History: Last updated: 09/01/2006 Depression  Past Surgical History: Last updated: 09/01/2006 UPPER/LOWER JAW REPAIR  10/07  Family History: Last updated: 26-Feb-2009 MOTHER:LIVING FATHER:DECEASED 3 BROTHERS:LIVING Family History Hypertension: M, F HEART: FATHER'S SIDE OF FAMILY Family History Diabetes 1st degree relative: BRAIN CANCER: MGF OTHER CANCER:PGF Family History Depression:BROTHER Family History of  Ovarian Cancer:MGM Family History of Stomach Cancer:PGM  Family History of Uterine Cancer:Mother No FH of Colon Cancer:  Social History: Last updated: 02/08/2009 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 Alert and cooperative with normal attention span and concentration. No apparent delusions, illusions, hallucinations   Impression & Recommendations:  Problem # 1:  CONTACT DERMATITIS&OTHER ECZEMA DUE UNSPEC CAUSE (ICD-692.9)  Her updated medication list for this problem includes:    Prednisone 20 Mg Tabs (Prednisone) .Marland KitchenMarland KitchenMarland KitchenMarland Kitchen 3 by mouth once daily for 3 days then 2 a day for 3 days then 1 a day for 3 days then 1/2 tab for 3 days antihistamine for itching  Discussed avoidance of triggers and symptomatic treatment.   Orders: Admin of Therapeutic Inj  intramuscular or subcutaneous (14782) Depo- Medrol 80mg  (J1040)  Complete Medication List: 1)  Aldactone 25 Mg Tabs (Spironolactone) .... 2 tabs by mouth three times a day 2)  Dextroamphetamine Sulfate 10 Mg Tabs (Dextroamphetamine sulfate) .... Take 2 tabs in morning and noon 3)  Prednisone 20 Mg Tabs (Prednisone) .... 3 by mouth once daily for 3 days then 2 a day for 3 days then 1 a day for 3 days then 1/2 tab for 3 days Prescriptions: PREDNISONE 20 MG TABS (PREDNISONE) 3 by mouth once daily for 3 days then 2 a day for 3 days then 1 a day for 3 days then 1/2 tab for 3 days  #21  x 0   Entered and Authorized by:   Loreen Freud DO   Signed by:   Loreen Freud DO on 08/27/2009   Method used:   Electronically to        [facility]* (retail)       [address]       [place], [place]  [address]       Ph: [phone]       Fax: [phone]   RxID:   [id number]    Medication Administration  Injection # 1:    Medication: Depo- Medrol 80mg     Diagnosis: CONTACT DERMATITIS&OTHER ECZEMA DUE UNSPEC CAUSE (ICD-692.9)    Route: IM    Site: RUOQ gluteus    Exp Date: 05/21/2010    Lot #: [id number]    Mfr: Pharmacia    Patient tolerated injection without complications    Given by: Jeremy Johann CMA (August 27, 2009 9:12 AM)  Orders Added: 1)  Est. Patient Level III [40102] 2)  Admin of Therapeutic Inj  intramuscular or subcutaneous [96372] 3)  Depo- Medrol 80mg  [J1040]

## 2010-02-19 NOTE — Progress Notes (Signed)
Summary: Biopsy Results  Phone Note Call from Patient Call back at [phone]   Call For: Dr Jarold Motto Reason for Call: Lab or Test Results Summary of Call: Liver Biopsy results. Hospital told her it only takes 3days. Initial call taken by: Leanor Kail [facility],  March 16, 2009 2:54 PM  Follow-up for Phone Call        Called pathology.  Report to be faxed.   Follow-up by: Ashok Cordia RN,  March 16, 2009 3:30 PM  Additional Follow-up for Phone Call Additional follow up Details #1::        Received report.  Reviewed by Dr. Jarold Motto.  Pt needs OV to discuss.  Appt sch.  Pt aware. Additional Follow-up by: Ashok Cordia RN,  March 16, 2009 4:33 PM

## 2010-02-19 NOTE — Letter (Signed)
Summary: [facility]  [facility]   Imported By: Lanelle Bal 06/04/2009 08:36:27  _____________________________________________________________________  External Attachment:    Type:   Image     Comment:   External Document

## 2010-02-19 NOTE — Assessment & Plan Note (Signed)
Summary: injection//lch  Nurse Visit   Allergies: 1)  ! Pcn  Immunizations Administered:  TwinRix # 2:    Vaccine Type: TwinRix    Site: right deltoid    Mfr: GlaxoSmithKline    Dose:    Route: IM    Given by: Jeremy Johann CMA    Exp. Date: 03/31/2011    Lot #: [id number]    VIS given: 10/08/06 version given August 01, 2009.  Orders Added: 1)  TwinRix 1ml ( Hep A&B Adult dose) [90636] 2)  Admin 1st Vaccine [90471]

## 2010-02-19 NOTE — Progress Notes (Signed)
Summary: Medication  Phone Note Call from Patient Call back at Home Phone [phone]   Caller: Patient Call For: Dr. Jarold Motto Reason for Call: Refill Medication Summary of Call: Needs a refill of Aldactone...last time she was here it was increased and needs new [facility] Initial call taken by: Karna Christmas,  May 15, 2009 8:29 AM    Prescriptions: ALDACTONE 25 MG TABS (SPIRONOLACTONE) 2 tabs by mouth three times a day  #180 x 3   Entered by:   Ashok Cordia RN   Authorized by:   Mardella Layman MD [facility]   Signed by:   Ashok Cordia RN on 05/15/2009   Method used:   Electronically to        [facility]* (retail)       [address]       [place], [place]  [address]       Ph: [phone]       Fax: [phone]   RxID:   [id number]

## 2010-02-19 NOTE — Progress Notes (Signed)
Summary: Imagining Results   Phone Note Outgoing Call   Call placed by: Army Fossa CMA,  February 19, 2009 10:30 AM Summary of Call: Imagining Results, LMTCB:  ovaries are normal + fibroids fax to Dr Janey Greaser  inform pt Signed by Loreen Freud DO on 02/19/2009 at 8:37 AM  Follow-up for Phone Call        lmtcb. Army Fossa CMA  February 19, 2009 4:19 PM   Additional Follow-up for Phone Call Additional follow up Details #1::        Pt is aware. Army Fossa CMA  February 21, 2009 1:24 PM

## 2010-02-19 NOTE — Progress Notes (Signed)
Summary: Biopsy results  Phone Note Call from Patient Call back at Work Phone [phone]   Caller: Patient Call For: Dr. Jarold Motto Reason for Call: Lab or Test Results Summary of Call: Calling about liver biopsy results Initial call taken by: Karna Christmas,  March 15, 2009 9:08 AM  Follow-up for Phone Call        Biopsy report is not back yet.  Pt notified.  We will call her when we get report. Follow-up by: Ashok Cordia RN,  March 15, 2009 1:20 PM

## 2010-02-19 NOTE — Letter (Signed)
Summary: Results Follow up Letter  [facility] [place]  [address] [place], [place] [address]   Phone: [phone]  Fax: [phone]    01/29/2009 MRN: [id number]    Sara Grimes [address] [place], [place]  [address]  Dear [name],  The following are the results of your recent test(s):  Test         Result    Pap Smear:        Normal _____  Not Normal _____ Comments: ______________________________________________________ Cholesterol: LDL(Bad cholesterol):         Your goal is less than:         HDL (Good cholesterol):       Your goal is more than: Comments:  ______________________________________________________ Mammogram:        Normal _____  Not Normal _____ Comments:  ___________________________________________________________________ Hemoccult:        Normal _____  Not normal _______ Comments:    _____________________________________________________________________ Other Tests:  PLEASE SEE HIGHLIGHTED LABS AND COMMENTS We routinely do not discuss normal results over the telephone.  If you desire a copy of the results, or you have any questions about this information we can discuss them at your next office visit.   Sincerely,

## 2010-02-19 NOTE — Assessment & Plan Note (Signed)
Summary: ELEVATED LABD/YF   History of Present Illness Visit Type: consult Primary GI MD: Sheryn Bison MD FACP FAGA Primary Provider: Loreen Freud, DO  Requesting Provider: Loreen Freud, DO  Chief Complaint: BRB when pt wipes after BMs, acid reflux, heartburn, and bloating  History of Present Illness:   This patient is a 55 year old Caucasian female who presents upon referral by Dr. Laury Axon for evaluation of edema and elevated liver function tests.  Sara Grimes has a long history of anxiety and depression and has been on alprazolam, Abilify, and Cymbalta although not a regular basis. Several months ago she developed edema in her lower extremities and has undergone evaluation by primary care which has  shown evidence of abnormal liver function tests with low serum albumin. Hepatitis serologies were negative, ANA was positive at titer 1:40, and liver ultrasound showed evidence of diffuse liver disease without biliary ductal dilatation. There was a small amount of ascites noted.  Patient denies abdominal pain, nausea vomiting, pruritus, mental status changes, fever chills, arthritis, skin rashes, but does have chronic fatigue. She denies alcohol abuse or use of over-the-counter medications or Tylenol. She does have daily acid reflux symptoms with p.r.n. antacid use. She has not had previous endoscopy or colonoscopy. She has soft bowel movements with occasional blood if she wipes vigorously.She Recently has had endometrial ablation By Dr. Cherly Hensen because of metromenorrhagia. She has a family history of uterine and ovarian cancer. Apparently she has pelvic ultrasound exam scheduled.  Patient denies any history of known pancreatitis or hepatitis, family history of liver disease, or previous GI workups. She denies illicit drug use or transfusions.   GI Review of Systems    Reports acid reflux, bloating, and  heartburn.      Denies abdominal pain, belching, chest pain, dysphagia with liquids, dysphagia with  solids, loss of appetite, nausea, vomiting, vomiting blood, weight loss, and  weight gain.      Reports change in bowel habits and  rectal bleeding.     Denies anal fissure, black tarry stools, constipation, diarrhea, diverticulosis, fecal incontinence, heme positive stool, hemorrhoids, irritable bowel syndrome, jaundice, light color stool, liver problems, and  rectal pain.    Current Medications (verified): 1)  Alprazolam 0.5 Mg  Tabs (Alprazolam) .... As Needed 2)  Abilify 15 Mg  Tabs (Aripiprazole) .Marland Kitchen.. 1 Once Daily 3)  Cymbalta 60 Mg  Cpep (Duloxetine Hcl) .Marland Kitchen.. 1 Once Daily  Allergies (verified): 1)  ! Pcn  Past History:  Past medical, surgical, family and social histories (including risk factors) reviewed for relevance to current acute and chronic problems.  Past Medical History: Reviewed history from 09/01/2006 and no changes required. Depression  Past Surgical History: Reviewed history from 09/01/2006 and no changes required. UPPER/LOWER JAW REPAIR  10/07  Family History: Reviewed history from 09/01/2006 and no changes required. MOTHER:LIVING FATHER:DECEASED 3 BROTHERS:LIVING Family History Hypertension: M, F HEART: FATHER'S SIDE OF FAMILY Family History Diabetes 1st degree relative: BRAIN CANCER: MGF OTHER CANCER:PGF Family History Depression:BROTHER Family History of Ovarian Cancer:MGM Family History of Stomach Cancer:PGM  Family History of Uterine Cancer:Mother  Social History: Reviewed history from 09/01/2006 and no changes required. 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  The patient denies allergy/sinus, anemia, anxiety-new, arthritis/joint pain, back pain, blood in urine, breast changes/lumps, change  in vision, confusion, cough,  coughing up blood, depression-new, fainting, fever, headaches-new, hearing problems, heart murmur, heart rhythm changes, itching, menstrual pain, muscle pains/cramps, pregnancy symptoms, shortness of breath, skin rash, sleeping problems, sore throat, swollen lymph glands, thirst - excessive , urination - excessive , urination changes/pain, vision changes, and voice change.   General:  Complains of sweats and fatigue. Eyes:  Denies blurring, diplopia, irritation, discharge, vision loss, scotoma, eye pain, and photophobia. ENT:  Complains of nasal congestion and nosebleeds; denies earache, ear discharge, tinnitus, decreased hearing, loss of smell, sore throat, hoarseness, and difficulty swallowing. CV:  Complains of dyspnea on exertion; denies chest pains, angina, palpitations, syncope, orthopnea, PND, peripheral edema, and claudication. Resp:  Complains of dyspnea with exercise. GI:  Complains of gas/bloating and change in bowel habits; denies difficulty swallowing, pain on swallowing, nausea, indigestion/heartburn, vomiting, vomiting blood, abdominal pain, jaundice, diarrhea, constipation, bloody BM's, black BMs, and fecal incontinence. GU:  Complains of urinary incontinence; denies urinary burning, blood in urine, nocturnal urination, urinary frequency, abnormal vaginal bleeding, amenorrhea, menorrhagia, vaginal discharge, pelvic pain, genital sores, painful intercourse, and decreased libido; She uses Lasix which caused excessive urination. She also complains of urinary incontinence 8 and on the care of urologist.. MS:  Denies joint pain / LOM, joint swelling, joint stiffness, joint deformity, low back pain, muscle weakness, muscle cramps, muscle atrophy, leg pain at night, leg pain with exertion, and shoulder pain / LOM hand / wrist pain (CTS). Derm:  Denies rash, itching, dry skin, hives, moles, warts, and unhealing ulcers; She complains of" nervous itching" but denies diffuse pruritus.. Neuro:  Denies  weakness, paralysis, abnormal sensation, seizures, syncope, tremors, vertigo, transient blindness, frequent falls, frequent headaches, difficulty walking, headache, sciatica, radiculopathy other:, restless legs, memory loss, and confusion. Psych:  Complains of depression and anxiety; denies memory loss, suicidal ideation, hallucinations, paranoia, phobia, and confusion. Endo:  Denies cold intolerance, heat intolerance, polydipsia, polyphagia, polyuria, unusual weight change, and hirsutism. Heme:  Denies bruising, bleeding, enlarged lymph nodes, and pagophagia. Allergy:  Denies hives, rash, sneezing, hay fever, and recurrent infections.  Vital Signs:  Patient profile:   55 year old female Height:      64 inches Weight:      166 pounds BMI:     28.60 BSA:     1.81 Pulse rate:   72 / minute Pulse rhythm:   regular BP sitting:   120 / 80  (left arm) Cuff size:   regular  Vitals Entered By: Ok Anis CMA (February 08, 2009 8:34 AM)  Physical Exam  General:  Well developed, well nourished, no acute distress.healthy appearing.  I cannot appreciate stigmata of chronic liver disease. Head:  Normocephalic and atraumatic. Eyes:  PERRLA, no icterus.exam deferred to patient's ophthalmologist.   Neck:  Supple; no masses or thyromegaly. Chest Wall:  Symmetrical;  no deformities or tenderness. Lungs:  Clear throughout to auscultation. Heart:  Regular rate and rhythm; no murmurs, rubs,  or bruits. Abdomen:  Soft, nontender and nondistended. No masses, hepatosplenomegaly or hernias noted. Normal bowel sounds.There is no hepatosplenomegaly, abdominal masses or ascites. Bowel sounds are normal without abdominal bruits. Rectal:  Normal exam.hemocult negative.   Msk:  Symmetrical with no gross deformities. Normal posture. Pulses:  Normal pulses noted. Extremities:  1+ pedal edema.   Neurologic:  Alert and  oriented x4;  grossly normal neurologically. Skin:  Intact without significant lesions or  rashes. Cervical Nodes:  No significant cervical adenopathy. Axillary Nodes:  No significant axillary adenopathy. Inguinal  Nodes:  No significant inguinal adenopathy. Psych:  Alert and cooperative. Normal mood and affect.   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 There no mental status changes or evidence of encephalopathy. Her liver enzyme pattern is most consistent with possible primary biliary cirrhosis, granulomatous hepatitis, or other metabolic causes of liver disease such as hemochromatosis, autoimmune liver disease, et Karie Soda. By history she denies alcohol abuse, but I will check CDT level low and other laboratory screening parameters. She will come back in 2 weeks for followup and I suspect we will need liver biopsy, endoscopy to assess for varices, and colonoscopy to complete her workup. I have ordered an alpha-fetoprotein, ammonia level, prothrombin time, ceruloplasmin, iron levels, et Karie Soda. Orders: TLB-Hepatic/Liver Function Pnl (80076-HEPATIC) TLB-B12, Serum-Total ONLY (16109-U04) TLB-Ferritin (82728-FER) TLB-Folic Acid (Folate) (82746-FOL) TLB-IBC Pnl (Iron/FE;Transferrin) (83550-IBC) TLB-Ammonia (82140-AMON) TLB-PT (Protime) (85610-PTP) T-Alpha-1-Antitrypsin Tot [phone]) T-AMA ([phone]) T-Anti SMA (86578-46962) T-Ceruloplasmin (95284-13244) T-Alpha-Fetoprotein Serum (01027-25366) T-CDT (carbohydrate deficient transferrin) (44034-74259)  Problem # 2:  EDEMA (ICD-782.3) Assessment: Unchanged She has hypoproteinemia and mild portal hypertension associated with chronic liver disease. She is scheduled for pelvic ultrasound and Doppler exams which I have ask her to complete. In the interim she is to start Aldactone 25 mg t.i.d. as  tolerated. I have asked her to avoid as many medications as possible including Tylenol. She again relates she has not taken any over-the-counter vitamin or protein supplements.  Problem # 3:  ANEMIA (ICD-285.9) Assessment: Unchanged She Appears to Have mild pancytopenia probably related to congestive splenomegaly. Review of her records does not show any real evidence that I can see of iron deficiency. She has had rather severe menorrhagia with recent gynecologic surgery. Blood loss could be masking underlying hemachromatosis and ferritin level has been ordered. I've asked her stop oral p.o. iron replacement  Problem # 4:  GERD (ICD-530.81) Assessment: Unchanged AcipHex 20 mg q.a.m. 30 minutes before breakfast  Problem # 5:  ABDOMINAL BLOATING (ICD-787.3) Assessment: Unchanged Related to small amount of underlying ascites.of course, with her family history and clinical picture we need to exclude occult ovarian carcinomatosis accounting for all of her presenting problems. I will send this letter to her gynecologist for review. As mentioned above, Doppler exam and pelvic ultrasound apparently are pending hopefully.  Problem # 6:  DEPRESSION (ICD-311) Assessment: Improved Hold Antidepressives.... the patient relates she has not taking  these medications in any case.  Patient Instructions: 1)  Copy sent to : Dr. Loreen Freud and Dr. Bobbie Stack in gynecology 2)  Labs pending 3)  New meds listed 4)  Stop iron and antidepressants 5)  Please schedule a follow-up appointment in 2 weeks.  6)  The medication list was reviewed and reconciled.  All changed / newly prescribed medications were explained.  A complete medication list was provided to the patient / caregiver. Prescriptions: ACIPHEX 20 MG TBEC (RABEPRAZOLE SODIUM) 1 by mouth qd  #30 x 6   Entered by:   Ashok Cordia RN   Authorized by:   Mardella Layman MD [facility]   Signed by:   Ashok Cordia RN on 02/08/2009   Method used:    Electronically to        [facility]* (retail)       [address]  [address]       [place], [place]  [address]       Ph: [phone]       Fax: [phone]   RxID:   [id number] ALDACTONE 25 MG TABS (SPIRONOLACTONE) 1 by mouth tid  #90 x 6   Entered by:   Ashok Cordia RN   Authorized by:   Mardella Layman MD [facility]   Signed by:   Ashok Cordia RN on 02/08/2009   Method used:   Electronically to        [facility]* (retail)       [address]       [place], [place]  [address]       Ph: [phone]       Fax: [phone]   RxID:   [id number]

## 2010-02-21 NOTE — Progress Notes (Signed)
Summary: Aldactone Refill  ---- Converted from flag ---- ---- 01/18/2010 3:28 PM, Almeta Monas CMA (AAMA) wrote: This patient is calling us for a refill on Aldactone. She stated she had labs done in November and there are labs that was sent to Dr.Patterson, are you guys going to fill? Per pt she has not been able to contact Dr.Darling ------------------------------  Phone Note Outgoing Call   Summary of Call: taking aldactone (spironolactone) 75 mg two times a day will refill for 1 month but patient needs to work out better plan for refills since she is not being regularly followed by [place] for this Initial call taken by: Iva Boop MD, Clementeen Graham,  January 18, 2010 3:44 PM  Follow-up for Phone Call        after Dr. Leone Payor refilled Dr Laury Axon refilled also I have called the pharm and asked them to DC one of the rxs.   Dr Jarold Motto should she get futher refills from Dr. Piedad Climes since she is following her now? or do you want to fill her meds and see her in the office?  Follow-up by: Harlow Mares CMA Duncan Dull),  January 18, 2010 3:57 PM  Additional Follow-up for Phone Call Additional follow up Details #1::        OK TO RENEW Additional Follow-up by: Mardella Layman MD Clementeen Graham,  January 23, 2010 12:06 PM    Additional Follow-up for Phone Call Additional follow up Details #2::    rx already filled Follow-up by: Harlow Mares CMA (AAMA),  January 23, 2010 2:10 PM  New/Updated Medications: SPIRONOLACTONE 25 MG TABS (SPIRONOLACTONE) 3 by mouth two times a day Prescriptions: SPIRONOLACTONE 25 MG TABS (SPIRONOLACTONE) 3 by mouth two times a day  #180 x 0   Entered and Authorized by:   Iva Boop MD, [facility]   Signed by:   Iva Boop MD, FACG on 01/18/2010   Method used:   Electronically to        [facility]* (retail)       [address]       [place], [place]  [address]       Ph: [phone]       Fax: [phone]   RxID:   [id number]

## 2010-02-21 NOTE — Progress Notes (Signed)
Summary: Medication Refill  Phone Note Call from Patient Call back at [phone]   Caller: Patient Call For: Dr. Jarold Motto Reason for Call: Talk to Nurse Summary of Call: Pt needs refill on Aldactone to  [facility] Initial call taken by: [place] Johnson,  January 15, 2010 1:08 PM  Follow-up for Phone Call        pt has been referred to [facility] to se Dr. Piedad Climes and the last labs I can see in EMR from [facility] is 06/25/2009. i have Left a message on patients machine to call back.  Follow-up by: Harlow Mares CMA Duncan Dull),  January 15, 2010 1:41 PM  Additional Follow-up for Phone Call Additional follow up Details #1::        spoke to the patient and she was last seen by Dr Piedad Climes in early November and had labs we do not have any of those records. Dr. Jarold Motto last advised pt to take Aldactone two tabs three times a day and Dr. Piedad Climes has asked pt to take the medication differntly at three tabs two times a day. Since Dr. Jarold Motto is not here and we do not have any recent labs I have asked the patient to call Dr Loyal Buba office for her refill.  Additional Follow-up by: Harlow Mares CMA Duncan Dull),  January 15, 2010 3:21 PM

## 2010-02-21 NOTE — Progress Notes (Signed)
Summary: refill  Phone Note Refill Request Call back at [phone] Message from:  Patient  Refills Requested: Medication #1:  ALDACTONE 25 MG TABS 2 tabs by mouth three times a day [facility].......Marland KitchenFelecia Deloach CMA  January 18, 2010 11:14 AM    Follow-up for Phone Call        This Rx was not filled here, initially written by Dr.Patterson and then changed by Dr.Darling and the patient was advised by Dr. Norval Gable office to contact Dr.Darling's office but the patient called here... Please advised on if you want to fill this med. Follow-up by: Almeta Monas CMA Duncan Dull),  January 18, 2010 12:01 PM  Additional Follow-up for Phone Call Additional follow up Details #1::        If Dr Piedad Climes changed dose---he needs to refill med-- If he wants [place] to take over writing it we can but if he is con't to see pt ( note says he will see her in 6 months) than he should be refilling med. Additional Follow-up by: Loreen Freud DO,  January 18, 2010 12:26 PM    Additional Follow-up for Phone Call Additional follow up Details #2::    Left message to call back'.... Almeta Monas CMA Duncan Dull)  January 18, 2010 2:38 PM   I spoke with patient and I advised her we did not fill this medication here, she will need to call Dr.Patterson. she stated they would not fill it because she doesn't have any recent labs. There are some labs on the system from November, I advised I will send Dr. Norval Gable nurse a flag. Per Dr.Lowne ok to fill since the patient is out of meds. Rx sent to [facility] in [place]... Pt aware...  Prescriptions: ALDACTONE 25 MG TABS (SPIRONOLACTONE) 2 tabs by mouth three times a day  #180 x 0   Entered by:   Almeta Monas CMA (AAMA)   Authorized by:   Loreen Freud DO   Signed by:   Almeta Monas CMA (AAMA) on 01/18/2010   Method used:   Faxed to ...       [facility]* (retail)       [address]       [place], [place]   [address]       Ph: [phone]       Fax: [phone]   RxID:   [id number]

## 2010-03-12 ENCOUNTER — Ambulatory Visit (INDEPENDENT_AMBULATORY_CARE_PROVIDER_SITE_OTHER): Payer: BC Managed Care – PPO | Admitting: Family Medicine

## 2010-03-12 ENCOUNTER — Encounter: Payer: Self-pay | Admitting: Family Medicine

## 2010-03-12 ENCOUNTER — Other Ambulatory Visit: Payer: Self-pay | Admitting: Family Medicine

## 2010-03-12 DIAGNOSIS — R110 Nausea: Secondary | ICD-10-CM

## 2010-03-12 DIAGNOSIS — K219 Gastro-esophageal reflux disease without esophagitis: Secondary | ICD-10-CM

## 2010-03-12 DIAGNOSIS — R109 Unspecified abdominal pain: Secondary | ICD-10-CM

## 2010-03-12 DIAGNOSIS — Z79899 Other long term (current) drug therapy: Secondary | ICD-10-CM

## 2010-03-13 LAB — CBC WITH DIFFERENTIAL/PLATELET
Basophils Absolute: 0.1 K/uL (ref 0.0–0.1)
Basophils Relative: 1.2 % (ref 0.0–3.0)
Eosinophils Absolute: 0.2 K/uL (ref 0.0–0.7)
Eosinophils Relative: 1.9 % (ref 0.0–5.0)
HCT: 34.2 % — ABNORMAL LOW (ref 36.0–46.0)
Hemoglobin: 11.9 g/dL — ABNORMAL LOW (ref 12.0–15.0)
Lymphocytes Relative: 41.2 % (ref 12.0–46.0)
Lymphs Abs: 3.4 K/uL (ref 0.7–4.0)
MCHC: 34.9 g/dL (ref 30.0–36.0)
MCV: 98.2 fl (ref 78.0–100.0)
Monocytes Absolute: 1.4 K/uL — ABNORMAL HIGH (ref 0.1–1.0)
Monocytes Relative: 16.7 % — ABNORMAL HIGH (ref 3.0–12.0)
Neutro Abs: 3.2 K/uL (ref 1.4–7.7)
Neutrophils Relative %: 39 % — ABNORMAL LOW (ref 43.0–77.0)
Platelets: 170 K/uL (ref 150.0–400.0)
RBC: 3.49 Mil/uL — ABNORMAL LOW (ref 3.87–5.11)
RDW: 15.3 % — ABNORMAL HIGH (ref 11.5–14.6)
WBC: 8.1 K/uL (ref 4.5–10.5)

## 2010-03-13 LAB — BASIC METABOLIC PANEL WITH GFR
BUN: 11 mg/dL (ref 6–23)
CO2: 26 meq/L (ref 19–32)
Calcium: 9.2 mg/dL (ref 8.4–10.5)
Chloride: 105 meq/L (ref 96–112)
Creatinine, Ser: 0.5 mg/dL (ref 0.4–1.2)
GFR: 128.15 mL/min
Glucose, Bld: 110 mg/dL — ABNORMAL HIGH (ref 70–99)
Potassium: 4.5 meq/L (ref 3.5–5.1)
Sodium: 138 meq/L (ref 135–145)

## 2010-03-13 LAB — HEPATIC FUNCTION PANEL
ALT: 26 U/L (ref 0–35)
Albumin: 3.2 g/dL — ABNORMAL LOW (ref 3.5–5.2)
Total Bilirubin: 2.8 mg/dL — ABNORMAL HIGH (ref 0.3–1.2)

## 2010-03-13 LAB — LIPASE: Lipase: 29 U/L (ref 11.0–59.0)

## 2010-03-13 LAB — AMYLASE: Amylase: 43 U/L (ref 27–131)

## 2010-03-13 LAB — H. PYLORI ANTIBODY, IGG: H Pylori IgG: NEGATIVE

## 2010-03-14 ENCOUNTER — Encounter (INDEPENDENT_AMBULATORY_CARE_PROVIDER_SITE_OTHER): Payer: Self-pay | Admitting: *Deleted

## 2010-03-19 NOTE — Assessment & Plan Note (Addendum)
Summary: nauseous/cbs   Vital Signs:  Patient profile:   55 year old female Weight:      127.4 pounds Temp:     98.6 degrees F oral BP sitting:   108 / 64  (left arm) Cuff size:   regular  Vitals Entered By: Almeta Monas CMA Duncan Dull) (March 12, 2010 4:07 PM) CC: c/o nausea and abdominal pain xfew days--says she has been constipated//also wants Bone density scan   History of Present Illness: Pt here c/o nausea and burping with increased reflux symptoms.  No vomiting.  No change in meds.  She has tried to get in touch with [facility] Dr in [place] but states she has trouble getting in touch with them.  Pt also needs bmd secondary to meds she is on.    Current Medications (verified): 1)  Spironolactone 25 Mg Tabs (Spironolactone) .... 3 By Mouth Two Times A Day 2)  Dextroamphetamine Sulfate 10 Mg Tabs (Dextroamphetamine Sulfate) .... Take 2 Tabs in Morning and Noon 3)  Ursodiol 300 Mg Caps (Ursodiol) .Marland Kitchen.. 1 By Mouth Three Times A Day 4)  Nexium 40 Mg Cpdr (Esomeprazole Magnesium) .Marland Kitchen.. 1 By Mouth Once Daily  Allergies (verified): 1)  ! Pcn  Past History:  Past medical, surgical, family and social histories (including risk factors) reviewed for relevance to current acute and chronic problems.  Past Medical History: Reviewed history from 09/01/2006 and no changes required. Depression  Past Surgical History: Reviewed history from 09/01/2006 and no changes required. UPPER/LOWER JAW REPAIR  10/07  Family History: Reviewed history from 02/23/2009 and no changes required. MOTHER:LIVING FATHER:DECEASED 3 BROTHERS:LIVING Family History Hypertension: M, F HEART: FATHER'S SIDE OF FAMILY Family History Diabetes 1st degree relative: BRAIN CANCER: MGF OTHER CANCER:PGF Family History Depression:BROTHER Family History of Ovarian Cancer:MGM Family History of Stomach Cancer:PGM  Family History of Uterine Cancer:Mother No FH of Colon Cancer:  Social History: Reviewed history from  02/08/2009 and no changes required. 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 Lungs are clear to auscultation, no crackles or wheezes. Heart:  Normal rate and regular rhythm. S1 and S2 normal without gallop, murmur, click, rub or other extra sounds. Abdomen:  Bowel sounds positive,abdomen soft and non-tender without masses, organomegaly or hernias noted. Psych:  Oriented X3 and normally interactive.     Impression & Recommendations:  Problem # 1:  LONG-TERM (CURRENT) USE OF OTHER MEDICATIONS (ICD-V58.69)  Orders: Radiology Referral (Radiology)  Problem # 2:  NAUSEA (ICD-787.02) nexium once daily  Discussed symptom control.   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 3 by mouth two times a day 2)  Dextroamphetamine Sulfate 10 Mg Tabs (Dextroamphetamine sulfate) .... Take 2 tabs in morning and noon 3)  Ursodiol 300 Mg Caps (Ursodiol) .Marland Kitchen.. 1 by mouth three times a day 4)  Nexium 40 Mg Cpdr (Esomeprazole magnesium) .Marland Kitchen.. 1 by mouth once daily  Other Orders: Venipuncture (16109) TLB-BMP (Basic Metabolic Panel-BMET) (80048-METABOL) TLB-CBC Platelet - w/Differential (85025-CBCD) TLB-Hepatic/Liver Function Pnl (80076-HEPATIC) TLB-Amylase (82150-AMYL) TLB-H. Pylori Abs(Helicobacter Pylori) (86677-HELICO) TLB-Lipase (83690-LIPASE) Specimen Handling (60454) Prescriptions: NEXIUM 40 MG CPDR (ESOMEPRAZOLE MAGNESIUM) 1 by mouth  once daily  #30 x 0   Entered and Authorized by:   Loreen Freud DO   Signed by:   Loreen Freud DO on  03/12/2010   Method used:   Historical   RxID:   [id number]    Orders Added: 1)  Venipuncture [86578] 2)  TLB-BMP (Basic Metabolic Panel-BMET) [80048-METABOL] 3)  TLB-CBC Platelet - w/Differential [85025-CBCD] 4)  TLB-Hepatic/Liver Function Pnl [80076-HEPATIC] 5)  TLB-Amylase [82150-AMYL] 6)  TLB-H. Pylori Abs(Helicobacter Pylori) [86677-HELICO] 7)  TLB-Lipase [83690-LIPASE] 8)  Specimen Handling [99000] 9)  Radiology Referral [Radiology] 10)  Est. Patient Level III [46962]  Appended Document: Orders Update    Clinical Lists Changes  Orders: Added new Test order of T-Bone Densitometry [phone]) - Signed Added new Test order of T-Lumbar Vertebral Assessment [phone]) - Signed

## 2010-03-19 NOTE — Letter (Signed)
Summary: Primary Care Consult Scheduled Letter  [facility]  [address] [place], [place] [address]   Phone: [phone]  Fax: [phone]      03/14/2010 MRN: [id number]  Sara Grimes [address] [place], [place]  [address]  [place]    Dear Ms. Daffern,    We have scheduled an appointment for you.  At the recommendation of Dr. Loreen Freud, we have scheduled you a Bone Density Scan with  Radiology on 04-01-2010  at 9:00am.  Their address is [address] [place], [place] [address]. The office phone number is [phone].  If this appointment day and time is not convenient for you, please feel free to call the office of the doctor you are being referred to at the number listed above and reschedule the appointment.    It is important for you to keep your scheduled appointments. We are here to make sure you are given good patient care.   Thank you,    Renee, Patient Care Coordinator  at [facility]

## 2010-03-29 ENCOUNTER — Other Ambulatory Visit: Payer: Self-pay | Admitting: Family Medicine

## 2010-03-29 DIAGNOSIS — Z79899 Other long term (current) drug therapy: Secondary | ICD-10-CM

## 2010-04-01 ENCOUNTER — Ambulatory Visit (INDEPENDENT_AMBULATORY_CARE_PROVIDER_SITE_OTHER)
Admission: RE | Admit: 2010-04-01 | Discharge: 2010-04-01 | Disposition: A | Discharge status: Home / Self Care | Payer: BC Managed Care – PPO | Source: Ambulatory Visit

## 2010-04-01 DIAGNOSIS — Z79899 Other long term (current) drug therapy: Secondary | ICD-10-CM

## 2010-04-10 LAB — CBC
MCHC: 34 g/dL (ref 30.0–36.0)
MCV: 96.2 fL (ref 78.0–100.0)
Platelets: 124 10*3/uL — ABNORMAL LOW (ref 150–400)
RBC: 3.63 MIL/uL — ABNORMAL LOW (ref 3.87–5.11)
RDW: 17 % — ABNORMAL HIGH (ref 11.5–15.5)

## 2010-04-10 LAB — APTT: aPTT: 45 seconds — ABNORMAL HIGH (ref 24–37)

## 2010-04-28 LAB — CBC
HCT: 36.4 % (ref 36.0–46.0)
Hemoglobin: 12.4 g/dL (ref 12.0–15.0)
WBC: 7.9 10*3/uL (ref 4.0–10.5)

## 2010-05-02 ENCOUNTER — Encounter: Payer: Self-pay | Admitting: Family Medicine

## 2010-06-04 ENCOUNTER — Encounter: Payer: Self-pay | Admitting: Family Medicine

## 2010-06-04 NOTE — Op Note (Signed)
[name], Sara Grimes                 ACCOUNT NO.:  [id number]   MEDICAL RECORD NO.:  [id number]          PATIENT TYPE:  AMB   LOCATION:  SDC                           FACILITY:  [facility]   PHYSICIAN:  Maxie Better, M.D.DATE OF BIRTH:  [birth year]   DATE OF PROCEDURE:  07/31/2008  DATE OF DISCHARGE:  07/31/2008                               OPERATIVE REPORT   PREOPERATIVE DIAGNOSES:  Dysfunctional uterine bleeding, question  endometrial polyp.   PROCEDURE:  Diagnostic hysteroscopy, dilation and curettage, removal  endometrial polyp, endometrial ablation with NovaSure.   POSTOPERATIVE DIAGNOSES:  Dysfunctional uterine bleeding, endometrial  polyp.   ANESTHESIA:  General.   SURGEON:  Maxie Better, MD   ASSISTANT:  None.   PROCEDURE:  Under adequate general anesthesia, the patient was placed in  the dorsal lithotomy position.  She was sterilely prepped and draped in  usual fashion.  Bladder was catheterized scant amount of urine.  Examination under anesthesia revealed anteverted uterus.  No adnexal  masses could be appreciated.  Bivalve speculum was placed in the vagina.  Single-tooth tenaculum was placed on the anterior lip of the cervix.  The cervix was dilated.  The cervical length was noted to be 8 cm.  The  endocervical length was 3.5.  The diagnostic hysteroscope was introduced  and a polypoid lesions noted anteriorly.  Through the diagnostic  hysteroscope, a polyp grasper was then used to grasp the polyp which was  then subsequently removed.  The cavity was then gently curetted for  moderate amount of tissue.  The cavity was then further dilated to  accept the NovaSure apparatus using the NovaSure safety techniques.  Cavity was tested.  Cavity width 2.5 was noted.  Power of 62 was  utilized in 1 minute and 50 seconds.  Width of ablation was performed.  The products was then removed.  The cavity was then inspected with the  diagnostic hysteroscope.  Good ablation was  noted.  The procedure was  then terminated by removing all instruments from the vagina.  Specimen  was endometrial polyp, endometrial curetting all sent to pathology.  Estimated blood loss was minimal.  Complication was none.  The patient  tolerated procedure well and was transferred to recovery room in stable  condition.      Maxie Better, M.D.  Electronically Signed     [place]/MEDQ  D:  07/31/2008  T:  08/01/2008  Job:  [id number]

## 2010-06-07 NOTE — Op Note (Signed)
Sara Grimes, Sara Grimes                             ACCOUNT NO.:  [id number]   MEDICAL RECORD NO.:  [id number]                   PATIENT TYPE:  AMB   LOCATION:  [facility]                                 FACILITY:  [facility]   PHYSICIAN:  Maretta Bees. Vonita Moss, M.D.             DATE OF BIRTH:  1955-10-19   DATE OF PROCEDURE:  06/26/2003  DATE OF DISCHARGE:                                 OPERATIVE REPORT   [name] [place]   PREOPERATIVE DIAGNOSIS:  Rule out interstitial cystitis.   POSTOPERATIVE DIAGNOSIS:  Rule out interstitial cystitis.   PROCEDURE:  Cystoscopy, hydraulic over distention of bladder and cold cup  bladder biopsies.   SURGEON:  Maretta Bees. Vonita Moss, M.D.   ANESTHESIA:  General.   INDICATIONS:  This 55 year old white female has had a long history of  frequency, urgency, and urge incontinence and was worked up 6-7 years ago in  [place] and told that she had a small capacity bladder, but apparently  never received any therapy.  Her pelvic pain frequency score was 16 which  gave her 75% chance of having IC.  She was brought to the OR today for  further evaluation.   DESCRIPTION OF PROCEDURE:  The patient was brought to the operating room and  placed in the lithotomy position.  External genitalia were prepped and  draped in the usual fashion. She was cystoscoped and the bladder was  unremarkable.  She only held 450 cc in the bladder and that was only after I  compressed the urethra that she started voiding around the cystoscope.  After emptying the bladder she did have scattered submucosal petechiae and  hemorrhage consistent with IC.  Cold cup bladder biopsies were taken at the  base into these abnormal areas.  The biopsy sites were fulgurated with the  Bugbee electrode.  The bladder was emptied; the scope removed; and the  patient sent to the recovery room in good condition having tolerated the  procedure well.     Maretta Bees. Vonita Moss, M.D.    [facility]/MEDQ  D:  06/26/2003  T:  06/26/2003  Job:  [id number]   cc:   Maxie Better, M.D.  [address]  [place]  [place] [address]  Fax: [phone]

## 2010-07-30 ENCOUNTER — Telehealth: Payer: Self-pay | Admitting: Family Medicine

## 2010-07-30 MED ORDER — MAGIC MOUTHWASH: 5.0000 mL | ORAL | Status: DC | PRN

## 2010-07-30 NOTE — Telephone Encounter (Signed)
Rx sent to pharmacy and VM left for patient--- KP

## 2010-07-30 NOTE — Telephone Encounter (Signed)
Ok to send one rx dukes magic mouthwash  #120cc  5 ml po swish and spit ---ov if doesn't work

## 2010-07-30 NOTE — Telephone Encounter (Signed)
Pt called says she has developed sores in her mouth from acidic foods says this is an ongoing issue. Has been using father's magic mouthwash which has helped and would like to have rx called in to pharmacy   [facility]

## 2010-08-30 ENCOUNTER — Ambulatory Visit (INDEPENDENT_AMBULATORY_CARE_PROVIDER_SITE_OTHER): Payer: BC Managed Care – PPO | Admitting: Family Medicine

## 2010-08-30 ENCOUNTER — Encounter: Payer: Self-pay | Admitting: Family Medicine

## 2010-08-30 DIAGNOSIS — R7401 Elevation of levels of liver transaminase levels: Secondary | ICD-10-CM

## 2010-08-30 NOTE — Assessment & Plan Note (Addendum)
Not PBC per [facility]---still unsure what it is They will send [place] an order Monday for labs---- provider is out of town Records from [place] reviewed

## 2010-08-30 NOTE — Progress Notes (Signed)
  Subjective:    Patient ID: Sara Grimes, female    DOB: 03-Jan-1956, 55 y.o.   MRN: [id number]  HPI Pt here f/u [facility] She was supposed to have labs done after stopping a medication but they did not give her an order for labs and notes do not mention labs.   Pt states she does not have PBC---they reveiwed all tests and biopsies.  Pt is feeling much better on spironolactone.   Review of Systems As above    Objective:   Physical Exam  Constitutional: She is oriented to person, place, and time. She appears well-developed and well-nourished.  Neurological: She is alert and oriented to person, place, and time.  Psychiatric: She has a normal mood and affect. Her behavior is normal. Judgment and thought content normal.          Assessment & Plan:

## 2010-09-05 ENCOUNTER — Other Ambulatory Visit: Payer: Self-pay | Admitting: Family Medicine

## 2010-09-05 DIAGNOSIS — K739 Chronic hepatitis, unspecified: Secondary | ICD-10-CM

## 2010-09-06 NOTE — Progress Notes (Signed)
Labs only

## 2010-09-10 ENCOUNTER — Other Ambulatory Visit (INDEPENDENT_AMBULATORY_CARE_PROVIDER_SITE_OTHER): Payer: BC Managed Care – PPO

## 2010-09-10 DIAGNOSIS — K739 Chronic hepatitis, unspecified: Secondary | ICD-10-CM

## 2010-09-10 LAB — BILIRUBIN, TOTAL: Total Bilirubin: 1.5 mg/dL — ABNORMAL HIGH (ref 0.3–1.2)

## 2010-09-10 LAB — BILIRUBIN, DIRECT: Bilirubin, Direct: 0.6 mg/dL — ABNORMAL HIGH (ref 0.0–0.3)

## 2010-09-10 LAB — AST: AST: 37 U/L (ref 0–37)

## 2010-09-10 LAB — ALT: ALT: 21 U/L (ref 0–35)

## 2010-09-10 NOTE — Progress Notes (Signed)
Labs only

## 2010-09-12 ENCOUNTER — Telehealth: Payer: Self-pay | Admitting: Family Medicine

## 2010-09-12 NOTE — Telephone Encounter (Signed)
Discussed with patient and scheduled repeat labs fr 10/07/10    KP

## 2010-09-30 ENCOUNTER — Telehealth: Payer: Self-pay | Admitting: *Deleted

## 2010-09-30 NOTE — Telephone Encounter (Signed)
Dr. Pappas

## 2010-09-30 NOTE — Telephone Encounter (Signed)
Pt states that she is currently seeing someone at [facility] for her liver but does not seem to be getting any results. Pt indicated that dr Laury Axon had mention name of dr at [facility] for the liver. Pt would now like to know Pt physician name.

## 2010-09-30 NOTE — Telephone Encounter (Signed)
Patient aware     KP 

## 2010-10-04 ENCOUNTER — Other Ambulatory Visit: Payer: Self-pay | Admitting: Family Medicine

## 2010-10-07 ENCOUNTER — Other Ambulatory Visit (INDEPENDENT_AMBULATORY_CARE_PROVIDER_SITE_OTHER): Payer: BC Managed Care – PPO

## 2010-10-07 DIAGNOSIS — K739 Chronic hepatitis, unspecified: Secondary | ICD-10-CM

## 2010-10-07 LAB — BILIRUBIN, DIRECT: Bilirubin, Direct: 0.5 mg/dL — ABNORMAL HIGH (ref 0.0–0.3)

## 2010-10-07 NOTE — Progress Notes (Signed)
12  

## 2010-10-15 ENCOUNTER — Encounter: Payer: Self-pay | Admitting: Family Medicine

## 2010-10-15 ENCOUNTER — Ambulatory Visit: Payer: BC Managed Care – PPO | Admitting: Family Medicine

## 2010-10-15 ENCOUNTER — Other Ambulatory Visit: Payer: Self-pay | Admitting: Dermatology

## 2010-10-15 ENCOUNTER — Ambulatory Visit (HOSPITAL_BASED_OUTPATIENT_CLINIC_OR_DEPARTMENT_OTHER)
Admission: RE | Admit: 2010-10-15 | Discharge: 2010-10-15 | Disposition: A | Discharge status: Home / Self Care | Payer: BC Managed Care – PPO | Source: Ambulatory Visit | Attending: Family Medicine | Admitting: Family Medicine

## 2010-10-15 ENCOUNTER — Ambulatory Visit (INDEPENDENT_AMBULATORY_CARE_PROVIDER_SITE_OTHER): Payer: BC Managed Care – PPO | Admitting: Family Medicine

## 2010-10-15 DIAGNOSIS — R109 Unspecified abdominal pain: Secondary | ICD-10-CM | POA: Insufficient documentation

## 2010-10-15 DIAGNOSIS — K5900 Constipation, unspecified: Secondary | ICD-10-CM

## 2010-10-15 LAB — POCT URINALYSIS DIPSTICK
Blood, UA: NEGATIVE
Nitrite, UA: NEGATIVE
Protein, UA: NEGATIVE
Spec Grav, UA: 1.01
Urobilinogen, UA: 0.2
pH, UA: 6.5

## 2010-10-15 LAB — CBC WITH DIFFERENTIAL/PLATELET
Basophils Relative: 0.5 % (ref 0.0–3.0)
Eosinophils Relative: 1.1 % (ref 0.0–5.0)
HCT: 31.5 % — ABNORMAL LOW (ref 36.0–46.0)
Hemoglobin: 10.5 g/dL — ABNORMAL LOW (ref 12.0–15.0)
Lymphs Abs: 3 10*3/uL (ref 0.7–4.0)
MCV: 96.3 fl (ref 78.0–100.0)
Monocytes Absolute: 1.2 10*3/uL — ABNORMAL HIGH (ref 0.1–1.0)
Neutro Abs: 4.5 10*3/uL (ref 1.4–7.7)
Platelets: 177 10*3/uL (ref 150.0–400.0)
WBC: 8.9 10*3/uL (ref 4.5–10.5)

## 2010-10-15 LAB — BASIC METABOLIC PANEL
BUN: 14 mg/dL (ref 6–23)
Chloride: 108 mEq/L (ref 96–112)
Glucose, Bld: 84 mg/dL (ref 70–99)
Potassium: 5.2 mEq/L — ABNORMAL HIGH (ref 3.5–5.1)
Sodium: 140 mEq/L (ref 135–145)

## 2010-10-15 LAB — HEPATIC FUNCTION PANEL
ALT: 22 U/L (ref 0–35)
AST: 42 U/L — ABNORMAL HIGH (ref 0–37)
Albumin: 3 g/dL — ABNORMAL LOW (ref 3.5–5.2)
Total Bilirubin: 1.7 mg/dL — ABNORMAL HIGH (ref 0.3–1.2)
Total Protein: 6.5 g/dL (ref 6.0–8.3)

## 2010-10-15 LAB — LIPASE: Lipase: 44 U/L (ref 11.0–59.0)

## 2010-10-15 LAB — AMYLASE: Amylase: 52 U/L (ref 27–131)

## 2010-10-15 NOTE — Progress Notes (Signed)
  Subjective:     Sara Grimes is a 55 y.o. female who presents for evaluation of abdominal pain. Onset was several weeks ago. Symptoms have been unchanged. The pain is described as colicky and cramping, and is 3/10 in intensity. Pain is located in the generalized without radiation.  Aggravating factors: none.  Alleviating factors: none. Associated symptoms: none. The patient denies anorexia, arthralagias, chills, diarrhea, dysuria, fever, flatus, frequency, headache, hematochezia, hematuria, melena, myalgias, nausea, sweats and vomiting.  The patient's history has been marked as reviewed and updated as appropriate.  Review of Systems Pertinent items are noted in HPI.     Objective:    BP 120/72  Pulse 75  Temp(Src) 98.7 F (37.1 C) (Oral)  Wt 129 lb (58.514 kg)  SpO2 98% General appearance: alert, cooperative, appears stated age and no distress Throat: lips, mucosa, and tongue normal; teeth and gums normal Lungs: clear to auscultation bilaterally Heart: regular rate and rhythm, S1, S2 normal, no murmur, click, rub or gallop Abdomen: soft, non-tender; bowel sounds normal; no masses,  no organomegaly Lymph nodes: Cervical, supraclavicular, and axillary nodes normal.    Assessment:    Abdominal pain, ? etiology .    Plan:    See orders for lab and imaging studies. Adhere to simple, bland diet. Further follow-up plans will be based on outcome of lab/imaging studies; see orders.

## 2010-10-15 NOTE — Patient Instructions (Signed)
Abdominal Pain Abdominal pain can be caused by many things. Your caregiver decides the seriousness of your pain by an examination and possibly blood tests and X-rays. Many cases can be observed and treated at home. Most abdominal pain is not caused by a disease and will probably improve without treatment. However, in many cases, more time must pass before a clear cause of the pain can be found. Before that point, it may not be known if you need more testing, or if hospitalization or surgery is needed. HOME CARE INSTRUCTIONS  Do not take laxatives unless directed by your caregiver.   Take pain medicine only as directed by your caregiver.   Only take over-the-counter or prescription medicines for pain, discomfort, or fever as directed by your caregiver.   Try a clear liquid diet (broth, tea, or water) for few days or as ordered by your caregiver. Slowly move to a bland diet as tolerated.  SEEK IMMEDIATE MEDICAL CARE IF:  The pain does not go away.   You or your child has an oral temperature above 100.4, not controlled by medicine.   You keep throwing up (vomiting).   The pain is felt only in portions of the abdomen. Pain in the right side could possibly be appendicitis. In an adult, pain in the left lower portion of the abdomen could be colitis or diverticulitis.   You pass bloody or black tarry stools.  MAKE SURE YOU:  Understand these instructions.   Will watch your condition.   Will get help right away if you are not doing well or get worse.  Document Released: 10/16/2004 Document Re-Released: 04/02/2009 ExitCare Patient Information 2011 [employer]. 

## 2010-10-15 NOTE — Progress Notes (Signed)
Discussed with Dr.Lowne at [facility]---Faxed to [facility] clinic and Dorcas Mcmurray per patient request    KP

## 2010-10-16 ENCOUNTER — Encounter: Payer: Self-pay | Admitting: Family Medicine

## 2010-10-22 ENCOUNTER — Telehealth: Payer: Self-pay

## 2010-10-22 NOTE — Telephone Encounter (Signed)
Spoke with patient and she wanted to go ahead and see GI and per Dr.Lowne  ok to schedule     KP

## 2010-10-28 ENCOUNTER — Telehealth: Payer: Self-pay

## 2010-10-28 NOTE — Telephone Encounter (Signed)
Call from patient and she requested a new referral to GI because the Doctor we referred her to will not see her for her disease per patient. She requested Dr.Diehl at [facility] phone # [phone] and fax [phone]. Dr.Lowne made aware      KP

## 2010-10-30 ENCOUNTER — Encounter: Payer: Self-pay | Admitting: Family Medicine

## 2010-10-30 ENCOUNTER — Ambulatory Visit (INDEPENDENT_AMBULATORY_CARE_PROVIDER_SITE_OTHER): Payer: BC Managed Care – PPO | Admitting: Family Medicine

## 2010-10-30 VITALS — BP 112/60 | HR 87 | Temp 98.6°F | Wt 124.0 lb

## 2010-10-30 DIAGNOSIS — J069 Acute upper respiratory infection, unspecified: Secondary | ICD-10-CM

## 2010-10-30 MED ORDER — DOXYCYCLINE HYCLATE 100 MG PO TABS: 100.0000 mg | ORAL_TABLET | Freq: Two times a day (BID) | ORAL | Status: AC

## 2010-10-30 MED ORDER — GUAIFENESIN-CODEINE 100-10 MG/5ML PO SYRP: 5.0000 mL | ORAL_SOLUTION | Freq: Two times a day (BID) | ORAL | Status: DC | PRN

## 2010-10-30 NOTE — Patient Instructions (Signed)
Common Cold, Adult An upper respiratory tract infection, or cold, is a viral infection of the air passages to the lung. Colds are contagious, especially during the first 3 or 4 days. Antibiotics cannot cure a cold. Cold germs are spread by coughs, sneezes, and hand to hand contact. A respiratory tract infection usually clears up in a few days, but some people may be sick for a week or two. HOME CARE INSTRUCTIONS  Only take over-the-counter or prescription medicines for pain, discomfort, or fever as directed by your caregiver.   Be careful not to blow your nose too hard. This may cause a nosebleed.   Use a cool-mist humidifier (vaporizer) to increase air moisture. This will make it easier for you to breath. Do not use hot steam.   Rest as much as possible and get plenty of sleep.   Wash your hands often, especially after you blow your nose. Cover your mouth and nose with a tissue when you sneeze or cough.   Drink at least 8 glasses of clear liquids every day, such as water, fruit juices, tea, clear soups, and carbonated beverages.  SEEK MEDICAL CARE IF:  An oral temperature above 100.4 lasts 4 days or more, and is not controlled by medication.   You have a sore throat that gets worse or you see white or yellow spots in your throat.   Your cough gets worse or lasts more than 10 days.   You have a rash somewhere on your skin. You have large and tender lumps in your neck.   You have an earache or a headache.   You have thick, greenish or yellowish discharge from your nose.   You cough-up thick yellow, green, gray or bloody mucus (secretions).  SEEK IMMEDIATE MEDICAL CARE IF: You have trouble breathing, chest pain, or your skin or nails look gray or blue. MAKE SURE YOU:   Understand these instructions.   Will watch your condition.   Will get help right away if you are not doing well or get worse.  Document Released: 01/04/2000 Document Re-Released: 12/20/2007 ExitCare Patient  Information 2011 [employer]. 

## 2010-10-30 NOTE — Progress Notes (Signed)
  Subjective:     Sara Grimes is a 55 y.o. female who presents for evaluation of symptoms of a URI. Symptoms include cough described as productive of yellow sputum, nasal congestion and post nasal drip. Onset of symptoms was 8 days ago, and has been gradually worsening since that time. Treatment to date: none.  The following portions of the patient's history were reviewed and updated as appropriate: allergies, current medications, past family history, past medical history, past social history, past surgical history and problem list.  Review of Systems Pertinent items are noted in HPI.   Objective:    BP 112/60  Pulse 87  Temp(Src) 98.6 F (37 C) (Oral)  Wt 124 lb (56.246 kg)  SpO2 97% General appearance: alert, cooperative, appears stated age and no distress Ears: normal TM's and external ear canals both ears Nose: yellow discharge, mild congestion, no sinus tenderness Throat: abnormal findings: mild oropharyngeal erythema and pnd Neck: no adenopathy and thyroid not enlarged, symmetric, no tenderness/mass/nodules Lungs: clear to auscultation bilaterally Extremities: extremities normal, atraumatic, no cyanosis or edema   Assessment:    viral upper respiratory illness   Plan:    Discussed diagnosis and treatment of URI. Suggested symptomatic OTC remedies. Nasal saline spray for congestion. Follow up as needed. mucinex and zyrtec  Pt to start doxy today for skin infection--derm gave it to her

## 2010-11-24 ENCOUNTER — Encounter (HOSPITAL_BASED_OUTPATIENT_CLINIC_OR_DEPARTMENT_OTHER): Payer: Self-pay | Admitting: *Deleted

## 2010-11-24 ENCOUNTER — Emergency Department (HOSPITAL_BASED_OUTPATIENT_CLINIC_OR_DEPARTMENT_OTHER)
Admission: EM | Admit: 2010-11-24 | Discharge: 2010-11-25 | Disposition: A | Discharge status: Home / Self Care | Payer: BC Managed Care – PPO | Attending: Emergency Medicine | Admitting: Emergency Medicine

## 2010-11-24 DIAGNOSIS — R109 Unspecified abdominal pain: Secondary | ICD-10-CM

## 2010-11-24 DIAGNOSIS — R1011 Right upper quadrant pain: Secondary | ICD-10-CM | POA: Insufficient documentation

## 2010-11-24 DIAGNOSIS — R1013 Epigastric pain: Secondary | ICD-10-CM | POA: Insufficient documentation

## 2010-11-24 HISTORY — DX: Primary biliary cirrhosis: K74.3

## 2010-11-24 MED ORDER — SODIUM CHLORIDE 0.9 % IV SOLN
Freq: Once | INTRAVENOUS | Status: AC
  Administered 2010-11-25: via INTRAVENOUS

## 2010-11-24 NOTE — ED Provider Notes (Signed)
History  Scribed for Toy Baker, MD, the patient was seen in [facility]. The chart was scribed by Gilman Schmidt. The patients care was started at 11:49 PM.   CSN: [id number] Arrival date & time: 11/24/2010 11:34 PM   First MD Initiated Contact with Patient 11/24/10 2345      Chief Complaint  Patient presents with  . Abdominal Pain   HPI Sara Grimes is a 55 y.o. female who presents to the Emergency Department complaining of abdominal pain. Pt describes constant aching pain in RUQ radiating to epigastric region. Sx are exacerbated by eating. Pt has taken ASA with no relief. Denies any V/D/N, fever, or urinary sx. Also notes abdominal swelling. States last BM was yesterday (medium in texture) and notes increase in flatulence. There are no other associated symptoms and no other alleviating or aggravating factors.     Past Medical History  Diagnosis Date  . Depression   . Primary biliary cirrhosis     Past Surgical History  Procedure Date  . Mandible surgery 10/07    upper and lower   . Upper and lower jaw repair   . Liver biopsies     Family History  Problem Relation Age of Onset  . Hypertension Mother   . Uterine cancer Mother   . Cancer Mother     UTERINE  . Hypertension Father   . Heart disease      Father's side of family  . Diabetes    . Brain cancer Maternal Grandfather   . Cancer Paternal Grandfather   . Depression Brother   . Ovarian cancer Maternal Grandmother   . Stomach cancer Maternal Grandmother   . Cancer Maternal Grandmother     OVARIAN  . Cancer Paternal Grandmother     STOMACH    History  Substance Use Topics  . Smoking status: Never Smoker   . Smokeless tobacco: Not on file  . Alcohol Use: Yes     socially    Review of Systems  Constitutional: Negative for fever.  Gastrointestinal: Positive for abdominal pain and abdominal distention. Negative for nausea, vomiting and diarrhea.  Genitourinary: Negative for dysuria, frequency, decreased  urine volume and difficulty urinating.  All other systems reviewed and are negative.    Allergies  Penicillins  Home Medications   Current Outpatient Rx  Name Route Sig Dispense Refill  . VITAMIN C 1000 MG PO TABS Oral Take 1,000 mg by mouth daily.      Marland Kitchen BETAMETHASONE DIPROPIONATE 0.05 % EX OINT Topical Apply 1 application topically 2 (two) times daily as needed. For rash     . VITAMIN D 1000 UNITS PO TABS Oral Take 1,000 Units by mouth daily.      Marland Kitchen CICLOPIROX OLAMINE 0.77 % EX CREA Topical Apply 1 application topically 2 (two) times daily. For toes    . DEXTROAMPHETAMINE SULFATE ER 10 MG PO CP24 Oral Take 10 mg by mouth 2 (two) times daily.     Marland Kitchen DOCUSATE SODIUM 100 MG PO CAPS Oral Take 100-400 mg by mouth daily.      . NYSTATIN 100000 UNIT/ML MT SUSP Oral Take 4 mLs by mouth 4 (four) times daily.      Marland Kitchen SPIRONOLACTONE 25 MG PO TABS Oral Take 75 mg by mouth 2 (two) times daily.     . TRAZODONE HCL 50 MG PO TABS Oral Take 50 mg by mouth at bedtime.     Marland Kitchen URSODIOL 300 MG PO CAPS Oral Take 300 mg  by mouth 3 (three) times daily.       BP 116/51  Pulse 88  Temp(Src) 98.5 F (36.9 C) (Oral)  Resp 20  Ht 5' 3.5" (1.613 m)  Wt 125 lb (56.7 kg)  BMI 21.80 kg/m2  SpO2 100%  Physical Exam  Constitutional: She is oriented to person, place, and time. She appears well-developed and well-nourished.  Non-toxic appearance. She does not have a sickly appearance.  HENT:  Head: Normocephalic and atraumatic.  Eyes: Conjunctivae, EOM and lids are normal. Pupils are equal, round, and reactive to light. No scleral icterus.  Neck: Trachea normal and normal range of motion. Neck supple.  Cardiovascular: Regular rhythm and normal heart sounds.   Pulmonary/Chest: Effort normal and breath sounds normal.  Abdominal: Soft. Normal appearance. There is tenderness in the right upper quadrant and epigastric area. There is no rebound, no guarding and no CVA tenderness.       Mild distension noted No  peritoneal signs   Musculoskeletal: Normal range of motion.  Neurological: She is alert and oriented to person, place, and time. She has normal strength.  Skin: Skin is warm, dry and intact. No rash noted.    ED Course  Procedures  DIAGNOSTIC STUDIES: Oxygen Saturation is 100% on room air, normal by my interpretation.    COORDINATION OF CARE: 11:49PM:  - Patient evaluated by ED physician, DG Abd Acute W/Chest, CBC, Diff, CMP, Lipase, UA ordered     MDM  Patient given IV fluids here feels better. Patient's repeat abdominal exam shows no signs of acute process. Patient still notes that her symptoms are worse with when she eats, Will place patient on PPI and Carafate and she will see her gastrointestinal doctor next week  I personally performed the services described in this documentation, which was scribed in my presence. The recorded information has been reviewed and considered. '       Toy Baker, MD 11/25/10 0110

## 2010-11-24 NOTE — ED Notes (Signed)
Pt states she has had abd pain and swelling since Friday. 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 Legs swelling.

## 2010-11-25 ENCOUNTER — Emergency Department (INDEPENDENT_AMBULATORY_CARE_PROVIDER_SITE_OTHER): Payer: BC Managed Care – PPO

## 2010-11-25 DIAGNOSIS — R109 Unspecified abdominal pain: Secondary | ICD-10-CM

## 2010-11-25 LAB — COMPREHENSIVE METABOLIC PANEL
Alkaline Phosphatase: 110 U/L (ref 39–117)
BUN: 9 mg/dL (ref 6–23)
Chloride: 104 mEq/L (ref 96–112)
Creatinine, Ser: 0.6 mg/dL (ref 0.50–1.10)
GFR calc Af Amer: >90 mL/min (ref >90)
GFR calc non Af Amer: >90 mL/min (ref >90)
Glucose, Bld: 106 mg/dL — ABNORMAL HIGH (ref 70–99)
Potassium: 4 mEq/L (ref 3.5–5.1)
Total Bilirubin: 1.4 mg/dL — ABNORMAL HIGH (ref 0.3–1.2)

## 2010-11-25 LAB — URINALYSIS, ROUTINE W REFLEX MICROSCOPIC
Bilirubin Urine: NEGATIVE
Ketones, ur: NEGATIVE mg/dL
Nitrite: NEGATIVE
Protein, ur: NEGATIVE mg/dL
Urobilinogen, UA: 1 mg/dL (ref 0.0–1.0)

## 2010-11-25 LAB — LIPASE, BLOOD: Lipase: 69 U/L — ABNORMAL HIGH (ref 11–59)

## 2010-11-25 LAB — DIFFERENTIAL
Basophils Relative: 0 % (ref 0–1)
Lymphocytes Relative: 35 % (ref 12–46)
Lymphs Abs: 3.3 10*3/uL (ref 0.7–4.0)
Monocytes Relative: 18 % — ABNORMAL HIGH (ref 3–12)
Neutro Abs: 4 10*3/uL (ref 1.7–7.7)

## 2010-11-25 LAB — CBC
HCT: 27.1 % — ABNORMAL LOW (ref 36.0–46.0)
Hemoglobin: 9.6 g/dL — ABNORMAL LOW (ref 12.0–15.0)
MCHC: 35.4 g/dL (ref 30.0–36.0)
MCV: 87.4 fL (ref 78.0–100.0)
WBC: 9.3 10*3/uL (ref 4.0–10.5)

## 2010-11-25 MED ORDER — PANTOPRAZOLE SODIUM 20 MG PO TBEC: 20.0000 mg | DELAYED_RELEASE_TABLET | Freq: Every day | ORAL | Status: DC

## 2010-11-25 MED ORDER — OXYCODONE-ACETAMINOPHEN 5-325 MG PO TABS
1.0000 | ORAL_TABLET | Freq: Once | ORAL | Status: AC
  Administered 2010-11-25: 1 via ORAL
  Filled 2010-11-25: qty 1

## 2010-11-25 MED ORDER — SUCRALFATE 1 G PO TABS: 1.0000 g | ORAL_TABLET | Freq: Four times a day (QID) | ORAL | Status: DC

## 2010-11-25 MED ORDER — OXYCODONE-ACETAMINOPHEN 5-325 MG PO TABS: 2.0000 | ORAL_TABLET | ORAL | Status: AC | PRN

## 2010-11-25 NOTE — ED Notes (Signed)
Rx x 3given for percocet, carafate, and protonix. D/c home with a ride

## 2010-11-28 ENCOUNTER — Other Ambulatory Visit (HOSPITAL_BASED_OUTPATIENT_CLINIC_OR_DEPARTMENT_OTHER): Payer: Self-pay | Admitting: Obstetrics and Gynecology

## 2010-11-28 DIAGNOSIS — Z1231 Encounter for screening mammogram for malignant neoplasm of breast: Secondary | ICD-10-CM

## 2010-12-03 ENCOUNTER — Ambulatory Visit (HOSPITAL_BASED_OUTPATIENT_CLINIC_OR_DEPARTMENT_OTHER)
Admission: RE | Admit: 2010-12-03 | Discharge: 2010-12-03 | Disposition: A | Discharge status: Home / Self Care | Payer: BC Managed Care – PPO | Source: Ambulatory Visit | Attending: Obstetrics and Gynecology | Admitting: Obstetrics and Gynecology

## 2010-12-03 DIAGNOSIS — Z1231 Encounter for screening mammogram for malignant neoplasm of breast: Secondary | ICD-10-CM | POA: Insufficient documentation

## 2010-12-10 ENCOUNTER — Telehealth: Payer: Self-pay | Admitting: Family Medicine

## 2010-12-10 NOTE — Telephone Encounter (Signed)
Letter mailed to contact the office to reschedule her apt... KP

## 2010-12-10 NOTE — Telephone Encounter (Signed)
Please send letter to pt

## 2010-12-10 NOTE — Telephone Encounter (Signed)
IN REFERENCE TO GASTROENTEROLOGY REFERRAL, PER MY CALL TO [facility], THE PATIENT CALLED THEM ON 11-25-10 & CANCELLED HER 12-10-10 APPT, STATED SHE WOULD CALL BACK TO RESCHEDULE.  I HAVE ATTEMPTED TO REACH PT MULTIPLE TIMES & LEFT VOICE MAILS OVER LAST SEVERAL WEEKS, SHE WILL NOT RESPOND TO ME.

## 2011-05-02 DIAGNOSIS — K745 Biliary cirrhosis, unspecified: Secondary | ICD-10-CM | POA: Insufficient documentation

## 2011-05-02 DIAGNOSIS — K766 Portal hypertension: Secondary | ICD-10-CM | POA: Insufficient documentation

## 2011-07-07 ENCOUNTER — Encounter: Payer: Self-pay | Admitting: Family Medicine

## 2011-07-07 ENCOUNTER — Ambulatory Visit (INDEPENDENT_AMBULATORY_CARE_PROVIDER_SITE_OTHER): Payer: BC Managed Care – PPO | Admitting: Family Medicine

## 2011-07-07 VITALS — BP 100/60 | HR 84 | Temp 98.3°F | Wt 123.6 lb

## 2011-07-07 DIAGNOSIS — R42 Dizziness and giddiness: Secondary | ICD-10-CM

## 2011-07-07 DIAGNOSIS — H6590 Unspecified nonsuppurative otitis media, unspecified ear: Secondary | ICD-10-CM

## 2011-07-07 MED ORDER — MECLIZINE HCL 25 MG PO TABS: 25.0000 mg | ORAL_TABLET | Freq: Three times a day (TID) | ORAL | Status: AC | PRN

## 2011-07-07 NOTE — Patient Instructions (Signed)
Serous Otitis Media   Serous otitis media is also known as otitis media with effusion (OME). It means there is fluid in the middle ear space. This space contains the bones for hearing and air. Air in the middle ear space helps to transmit sound.   The air gets there through the eustachian tube. This tube goes from the back of the throat to the middle ear space. It keeps the pressure in the middle ear the same as the outside world. It also helps to drain fluid from the middle ear space.  CAUSES   OME occurs when the eustachian tube gets blocked. Blockage can come from:   Ear infections.   Colds and other upper respiratory infections.   Allergies.   Irritants such as cigarette smoke.   Sudden changes in air pressure (such as descending in an airplane).   Enlarged adenoids.  During colds and upper respiratory infections, the middle ear space can become temporarily filled with fluid. This can happen after an ear infection also. Once the infection clears, the fluid will generally drain out of the ear through the eustachian tube. If it does not, then OME occurs.  SYMPTOMS    Hearing loss.   A feeling of fullness in the ear - but no pain.   Young children may not show any symptoms.  DIAGNOSIS    Diagnosis of OME is made by an ear exam.   Tests may be done to check on the movement of the eardrum.   Hearing exams may be done.  TREATMENT    The fluid most often goes away without treatment.   If allergy is the cause, allergy treatment may be helpful.   Fluid that persists for several months may require minor surgery. A small tube is placed in the ear drum to:   Drain the fluid.   Restore the air in the middle ear space.   In certain situations, antibiotics are used to avoid surgery.   Surgery may be done to remove enlarged adenoids (if this is the cause).  HOME CARE INSTRUCTIONS    Keep children away from tobacco smoke.   Be sure to keep follow up appointments, if any.  SEEK MEDICAL CARE IF:    Hearing is  not better in 3 months.   Hearing is worse.   Ear pain.   Drainage from the ear.   Dizziness.  Document Released: 03/29/2003 Document Revised: 12/26/2010 Document Reviewed: 01/27/2008  ExitCare Patient Information 2012 [employer].

## 2011-07-07 NOTE — Progress Notes (Signed)
  Subjective:     Sara Grimes is a 56 y.o. female who presents for evaluation of dizziness. The symptoms started 1 day ago and are improved. The attacks started this am and last all day . Positions that worsen symptoms: any motion, bending over, head back, rolling in bed to the left, rolling in bed to the right and turning head. Previous workup/treatments: none. Associated ear symptoms: none. Associated CNS symptoms: none. Recent infections: none. Head trauma: denied. Drug ingestion: none. Noise exposure: no occupational exposure. Family history: non-contributory.  The following portions of the patient's history were reviewed and updated as appropriate: allergies, current medications, past family history, past medical history, past social history, past surgical history and problem list.  Review of Systems Pertinent items are noted in HPI.    Objective:    BP 100/60  Pulse 84  Temp 98.3 F (36.8 C) (Oral)  Wt 123 lb 9.6 oz (56.065 kg)  SpO2 98% General appearance: alert, cooperative, appears stated age and no distress Head: Normocephalic, without obvious abnormality, atraumatic Eyes: conjunctivae/corneas clear. PERRL, EOM's intact. Fundi benign. Ears: R tm --bulging and + fluid Nose: Nares normal. Septum midline. Mucosa normal. No drainage or sinus tenderness. Throat: lips, mucosa, and tongue normal; teeth and gums normal Neck: no adenopathy, no carotid bruit, no JVD, supple, symmetrical, trachea midline and thyroid not enlarged, symmetric, no tenderness/mass/nodules Lungs: clear to auscultation bilaterally Heart: S1, S2 normal      Assessment:    serous otitis with dizziness    Plan:    Meclizine per medication orders. antihistamine , dymista and may try decongestant prn

## 2011-08-06 ENCOUNTER — Emergency Department (HOSPITAL_BASED_OUTPATIENT_CLINIC_OR_DEPARTMENT_OTHER): Payer: BC Managed Care – PPO

## 2011-08-06 ENCOUNTER — Encounter (HOSPITAL_BASED_OUTPATIENT_CLINIC_OR_DEPARTMENT_OTHER): Payer: Self-pay | Admitting: *Deleted

## 2011-08-06 ENCOUNTER — Emergency Department (HOSPITAL_BASED_OUTPATIENT_CLINIC_OR_DEPARTMENT_OTHER)
Admission: EM | Admit: 2011-08-06 | Discharge: 2011-08-06 | Disposition: A | Discharge status: Home / Self Care | Payer: BC Managed Care – PPO | Attending: Emergency Medicine | Admitting: Emergency Medicine

## 2011-08-06 DIAGNOSIS — M25519 Pain in unspecified shoulder: Secondary | ICD-10-CM

## 2011-08-06 DIAGNOSIS — M542 Cervicalgia: Secondary | ICD-10-CM

## 2011-08-06 DIAGNOSIS — R209 Unspecified disturbances of skin sensation: Secondary | ICD-10-CM | POA: Insufficient documentation

## 2011-08-06 MED ORDER — OXYCODONE-ACETAMINOPHEN 5-325 MG PO TABS
2.0000 | ORAL_TABLET | Freq: Once | ORAL | Status: AC
  Administered 2011-08-06: 2 via ORAL
  Filled 2011-08-06: qty 2

## 2011-08-06 MED ORDER — HYDROCODONE-ACETAMINOPHEN 5-325 MG PO TABS: 2.0000 | ORAL_TABLET | ORAL | Status: AC | PRN

## 2011-08-06 MED ORDER — PREDNISONE 10 MG PO TABS: ORAL_TABLET | ORAL | Status: DC

## 2011-08-06 MED ORDER — ONDANSETRON 4 MG PO TBDP
4.0000 mg | ORAL_TABLET | Freq: Once | ORAL | Status: AC
  Administered 2011-08-06: 4 mg via ORAL
  Filled 2011-08-06: qty 1

## 2011-08-06 NOTE — ED Provider Notes (Signed)
History     CSN: [id number]  Arrival date & time 08/06/11  4098   First MD Initiated Contact with Patient 08/06/11 2014      Chief Complaint  Patient presents with  . Shoulder Pain    (Consider location/radiation/quality/duration/timing/severity/associated sxs/prior treatment) Patient is a 56 y.o. female presenting with shoulder pain. The history is provided by the patient. No language interpreter was used.  Shoulder Pain This is a new problem. The current episode started today. The problem occurs constantly. The problem has been unchanged. Associated symptoms include joint swelling. Nothing aggravates the symptoms. She has tried nothing for the symptoms. The treatment provided moderate relief.   Pt complains of pain in right shoulder today.  Pt denies any injury.  Pt reports pain with trying to lift shoulder. Pt reports she recently stopped plaquenil  For her lupus.   Past Medical History  Diagnosis Date  . Depression   . Primary biliary cirrhosis     Past Surgical History  Procedure Date  . Mandible surgery 10/07    upper and lower   . Upper and lower jaw repair   . Liver biopsies     Family History  Problem Relation Age of Onset  . Hypertension Mother   . Uterine cancer Mother   . Cancer Mother     UTERINE  . Hypertension Father   . Heart disease      Father's side of family  . Diabetes    . Brain cancer Maternal Grandfather   . Cancer Paternal Grandfather   . Depression Brother   . Ovarian cancer Maternal Grandmother   . Stomach cancer Maternal Grandmother   . Cancer Maternal Grandmother     OVARIAN  . Cancer Paternal Grandmother     STOMACH    History  Substance Use Topics  . Smoking status: Never Smoker   . Smokeless tobacco: Not on file  . Alcohol Use: Yes     socially    OB History    Grav Para Term Preterm Abortions TAB SAB Ect Mult Living                  Review of Systems  Musculoskeletal: Positive for joint swelling.  All other  systems reviewed and are negative.    Allergies  Penicillins  Home Medications   Current Outpatient Rx  Name Route Sig Dispense Refill  . BETAMETHASONE DIPROPIONATE 0.05 % EX OINT Topical Apply 1 application topically 2 (two) times daily as needed. For rash     . CICLOPIROX OLAMINE 0.77 % EX CREA Topical Apply 1 application topically 2 (two) times daily. For toes    . DEXTROAMPHETAMINE SULFATE ER 10 MG PO CP24 Oral Take 10 mg by mouth 2 (two) times daily.     Marland Kitchen POLYETHYLENE GLYCOL 3350 PO PACK Oral Take 17 g by mouth daily.    Marland Kitchen SPIRONOLACTONE 25 MG PO TABS Oral Take 75 mg by mouth 2 (two) times daily.     Marland Kitchen URSODIOL 300 MG PO CAPS Oral Take 300 mg by mouth 3 (three) times daily.     Marland Kitchen VITAMIN C 1000 MG PO TABS Oral Take 1,000 mg by mouth daily.      Marland Kitchen VITAMIN D 1000 UNITS PO TABS Oral Take 1,000 Units by mouth daily.      . TRAZODONE HCL 50 MG PO TABS Oral Take 50 mg by mouth at bedtime.       BP 122/53  Pulse 78  Temp 98.3  F (36.8 C)  Resp 16  Ht 5\' 3"  (1.6 m)  Wt 122 lb (55.339 kg)  BMI 21.61 kg/m2  SpO2 100%  Physical Exam  Nursing note and vitals reviewed. Constitutional: She is oriented to person, place, and time. She appears well-developed and well-nourished.  HENT:  Head: Normocephalic and atraumatic.  Eyes: Conjunctivae and EOM are normal. Pupils are equal, round, and reactive to light.  Neck: Normal range of motion. Neck supple.  Pulmonary/Chest: Breath sounds normal.  Musculoskeletal: She exhibits tenderness.       Tender right shoulder, decreased range of motion,  nv and ns intact,  c spine diffusely tender decreased range of motion.    Neurological: She is alert and oriented to person, place, and time. She has normal reflexes.  Skin: Skin is warm.  Psychiatric: She has a normal mood and affect.    ED Course  Procedures (including critical care time)  Labs Reviewed - No data to display Dg Cervical Spine Complete  08/06/2011  *RADIOLOGY REPORT*   Clinical Data: Numbness and tingling down the right arm  CERVICAL SPINE - COMPLETE 4+ VIEW  Comparison: None.  Findings: There is reversal of the lordosis in the lower cervical spine.  There is narrowing of the C5-6 and C6-7 interspaces with endplate spurring.  No prevertebral soft tissue swelling.  Negative for fracture.  Multiple dental restorations.  Fixation hardware in the mandible and face.  IMPRESSION: 1.  Negative for fracture or other acute bony abnormality. 2.  Degenerative disc disease C5-C7. 3. Loss of the normal cervical spine lordosis, which may be secondary to positioning, spasm, or soft tissue injury.  Original Report Authenticated By: Osa Craver, M.D.   Dg Shoulder Right  08/06/2011  *RADIOLOGY REPORT*  Clinical Data: Right shoulder pain with numbness and tingling in the distal humerus.  Sudden onset today.  No injury.  RIGHT SHOULDER - 2+ VIEW  Comparison: None.  Findings: The right shoulder appears intact.  No evidence of acute fracture or subluxation.  No focal bone lesion or bone destruction. Bone cortex and trabecular architecture appear intact.  No radiopaque soft tissue foreign bodies.  Coracoclavicular and acromioclavicular spaces are maintained.  IMPRESSION: No acute bony abnormalities.  Original Report Authenticated By: Marlon Pel, M.D.     No diagnosis found.    MDM  Pt advised to see her Physicain for recheck in 2-3 days.   I will try prednisone and hydrocodone, (pt nauseated with percocet)        Elson Areas, PA 08/06/11 2153

## 2011-08-06 NOTE — ED Notes (Signed)
PA at bedside.

## 2011-08-06 NOTE — ED Notes (Signed)
Pt c/o right shoulder pain which radiates to wrist increased pain with movt no injury

## 2011-08-06 NOTE — ED Provider Notes (Signed)
Medical screening examination/treatment/procedure(s) were performed by non-physician practitioner and as supervising physician I was immediately available for consultation/collaboration.   Gwyneth Sprout, MD 08/06/11 2256

## 2011-10-06 ENCOUNTER — Other Ambulatory Visit (HOSPITAL_COMMUNITY): Payer: Self-pay | Admitting: Rheumatology

## 2011-10-06 ENCOUNTER — Other Ambulatory Visit (HOSPITAL_COMMUNITY): Payer: Self-pay | Admitting: Radiology

## 2011-10-06 DIAGNOSIS — R0602 Shortness of breath: Secondary | ICD-10-CM

## 2011-10-06 DIAGNOSIS — M329 Systemic lupus erythematosus, unspecified: Secondary | ICD-10-CM

## 2011-10-10 ENCOUNTER — Other Ambulatory Visit (HOSPITAL_COMMUNITY): Payer: BC Managed Care – PPO

## 2011-10-10 ENCOUNTER — Inpatient Hospital Stay (HOSPITAL_COMMUNITY): Admission: RE | Admit: 2011-10-10 | Payer: BC Managed Care – PPO | Source: Ambulatory Visit

## 2011-10-21 ENCOUNTER — Other Ambulatory Visit (HOSPITAL_COMMUNITY): Payer: BC Managed Care – PPO

## 2011-10-21 ENCOUNTER — Encounter (HOSPITAL_COMMUNITY): Payer: BC Managed Care – PPO

## 2011-10-30 ENCOUNTER — Ambulatory Visit (HOSPITAL_COMMUNITY)
Admission: RE | Admit: 2011-10-30 | Discharge: 2011-10-30 | Disposition: A | Discharge status: Home / Self Care | Payer: BC Managed Care – PPO | Source: Ambulatory Visit | Attending: Rheumatology | Admitting: Rheumatology

## 2011-10-30 DIAGNOSIS — R0602 Shortness of breath: Secondary | ICD-10-CM

## 2011-10-30 DIAGNOSIS — M329 Systemic lupus erythematosus, unspecified: Secondary | ICD-10-CM

## 2011-10-30 MED ORDER — ALBUTEROL SULFATE (5 MG/ML) 0.5% IN NEBU
2.5000 mg | INHALATION_SOLUTION | Freq: Once | RESPIRATORY_TRACT | Status: AC
  Administered 2011-10-30: 2.5 mg via RESPIRATORY_TRACT

## 2011-11-02 ENCOUNTER — Encounter (HOSPITAL_BASED_OUTPATIENT_CLINIC_OR_DEPARTMENT_OTHER): Payer: Self-pay | Admitting: *Deleted

## 2011-11-02 ENCOUNTER — Emergency Department (HOSPITAL_BASED_OUTPATIENT_CLINIC_OR_DEPARTMENT_OTHER)
Admission: EM | Admit: 2011-11-02 | Discharge: 2011-11-02 | Disposition: A | Discharge status: Home / Self Care | Payer: BC Managed Care – PPO | Attending: Emergency Medicine | Admitting: Emergency Medicine

## 2011-11-02 DIAGNOSIS — F3289 Other specified depressive episodes: Secondary | ICD-10-CM | POA: Insufficient documentation

## 2011-11-02 DIAGNOSIS — Z880 Allergy status to penicillin: Secondary | ICD-10-CM | POA: Insufficient documentation

## 2011-11-02 DIAGNOSIS — K766 Portal hypertension: Secondary | ICD-10-CM | POA: Insufficient documentation

## 2011-11-02 DIAGNOSIS — R109 Unspecified abdominal pain: Secondary | ICD-10-CM | POA: Insufficient documentation

## 2011-11-02 DIAGNOSIS — K745 Biliary cirrhosis, unspecified: Secondary | ICD-10-CM | POA: Insufficient documentation

## 2011-11-02 DIAGNOSIS — F329 Major depressive disorder, single episode, unspecified: Secondary | ICD-10-CM | POA: Insufficient documentation

## 2011-11-02 HISTORY — DX: Portal hypertension: K76.6

## 2011-11-02 LAB — COMPREHENSIVE METABOLIC PANEL
Albumin: 3 g/dL — ABNORMAL LOW (ref 3.5–5.2)
Alkaline Phosphatase: 113 U/L (ref 39–117)
BUN: 9 mg/dL (ref 6–23)
Creatinine, Ser: 0.5 mg/dL (ref 0.50–1.10)
Potassium: 4.1 mEq/L (ref 3.5–5.1)
Total Protein: 6.7 g/dL (ref 6.0–8.3)

## 2011-11-02 LAB — CBC WITH DIFFERENTIAL/PLATELET
Basophils Relative: 0 % (ref 0–1)
Eosinophils Absolute: 0.1 10*3/uL (ref 0.0–0.7)
Hemoglobin: 9.9 g/dL — ABNORMAL LOW (ref 12.0–15.0)
MCH: 29.7 pg (ref 26.0–34.0)
MCHC: 34.5 g/dL (ref 30.0–36.0)
Monocytes Relative: 16 % — ABNORMAL HIGH (ref 3–12)
Neutrophils Relative %: 62 % (ref 43–77)
RDW: 17.7 % — ABNORMAL HIGH (ref 11.5–15.5)

## 2011-11-02 LAB — URINALYSIS, ROUTINE W REFLEX MICROSCOPIC
Leukocytes, UA: NEGATIVE
Nitrite: NEGATIVE
Specific Gravity, Urine: 1.022 (ref 1.005–1.030)
pH: 7 (ref 5.0–8.0)

## 2011-11-02 LAB — LIPASE, BLOOD: Lipase: 43 U/L (ref 11–59)

## 2011-11-02 MED ORDER — ONDANSETRON 8 MG PO TBDP: 8.0000 mg | ORAL_TABLET | Freq: Three times a day (TID) | ORAL | Status: DC | PRN

## 2011-11-02 MED ORDER — OXYCODONE HCL 5 MG PO TABS: 5.0000 mg | ORAL_TABLET | ORAL | Status: DC | PRN

## 2011-11-02 NOTE — ED Notes (Signed)
Patient c/o R lower back pain for the past three days that is starting to radiate to R side of abd, has been taking 1600 mg ibuprofen each day but no relief, no n/v. Constant urination, patient states she has low bladder capacity, no meds taken today

## 2011-11-02 NOTE — ED Provider Notes (Signed)
History     CSN: [id number]  Arrival date & time 11/02/11  1324   First MD Initiated Contact with Patient 11/02/11 1004      Chief Complaint  Patient presents with  . Back Pain    HPI Patient presents to the emergency room with complaints of right flank/lower back pain for the last 3 days. The pain is sharp and somewhat constant. It does increase somewhat with movement. She denies any trouble with her appetite. She denies any nausea vomiting or diarrhea. Patient has been taking nonsteroidal anti-inflammatory medications however that has not resolved the symptoms. Patient is in the process of a rheumatological type workup. She has been having some trouble with her breathing and as part of an evaluation had a pulmonary function test as well as a CT scan of the chest this past Thursday.  The patient has had urinary frequency. She is concerned about the possibility of a kidney stone. Past Medical History  Diagnosis Date  . Depression   . Primary biliary cirrhosis   . Portal hypertension     Past Surgical History  Procedure Date  . Mandible surgery 10/07    upper and lower   . Upper and lower jaw repair   . Liver biopsies     Family History  Problem Relation Age of Onset  . Hypertension Mother   . Uterine cancer Mother   . Cancer Mother     UTERINE  . Hypertension Father   . Heart disease      Father's side of family  . Diabetes    . Brain cancer Maternal Grandfather   . Cancer Paternal Grandfather   . Depression Brother   . Ovarian cancer Maternal Grandmother   . Stomach cancer Maternal Grandmother   . Cancer Maternal Grandmother     OVARIAN  . Cancer Paternal Grandmother     STOMACH    History  Substance Use Topics  . Smoking status: Never Smoker   . Smokeless tobacco: Not on file  . Alcohol Use: Yes     socially    OB History    Grav Para Term Preterm Abortions TAB SAB Ect Mult Living                  Review of Systems  Constitutional: Positive for  fever.  Respiratory: Negative for shortness of breath.   Gastrointestinal: Negative for vomiting and diarrhea.  Genitourinary: Positive for frequency.  All other systems reviewed and are negative.    Allergies  Penicillins  Home Medications   Current Outpatient Rx  Name Route Sig Dispense Refill  . HYDROXYCHLOROQUINE SULFATE 200 MG PO TABS Oral Take by mouth 2 (two) times daily.    Marland Kitchen VITAMIN C 1000 MG PO TABS Oral Take 1,000 mg by mouth daily.      Marland Kitchen BETAMETHASONE DIPROPIONATE 0.05 % EX OINT Topical Apply 1 application topically 2 (two) times daily as needed. For rash     . VITAMIN D 1000 UNITS PO TABS Oral Take 1,000 Units by mouth daily.      Marland Kitchen CICLOPIROX OLAMINE 0.77 % EX CREA Topical Apply 1 application topically 2 (two) times daily. For toes    . DEXTROAMPHETAMINE SULFATE ER 10 MG PO CP24 Oral Take 10 mg by mouth 2 (two) times daily.     Marland Kitchen POLYETHYLENE GLYCOL 3350 PO PACK Oral Take 17 g by mouth daily.    Marland Kitchen PREDNISONE 10 MG PO TABS  6,5,4,3,2,1 21 tablet 0  .  SPIRONOLACTONE 25 MG PO TABS Oral Take 75 mg by mouth 2 (two) times daily.     . TRAZODONE HCL 50 MG PO TABS Oral Take 50 mg by mouth at bedtime.     Marland Kitchen URSODIOL 300 MG PO CAPS Oral Take 300 mg by mouth 3 (three) times daily.       BP 126/59  Pulse 95  Temp 98.7 F (37.1 C) (Oral)  Resp 18  Ht 5\' 3"  (1.6 m)  Wt 122 lb (55.339 kg)  BMI 21.61 kg/m2  SpO2 98%  Physical Exam  Nursing note and vitals reviewed. Constitutional: She appears well-developed and well-nourished. No distress.  HENT:  Head: Normocephalic and atraumatic.  Right Ear: External ear normal.  Left Ear: External ear normal.  Eyes: Conjunctivae normal are normal. Right eye exhibits no discharge. Left eye exhibits no discharge. No scleral icterus.  Neck: Neck supple. No tracheal deviation present.  Cardiovascular: Normal rate, regular rhythm and intact distal pulses.   Pulmonary/Chest: Effort normal and breath sounds normal. No stridor. No  respiratory distress. She has no wheezes. She has no rales.  Abdominal: Soft. Bowel sounds are normal. She exhibits no distension and no mass. There is no tenderness. There is CVA tenderness (right-sided). There is no rebound and no guarding.  Musculoskeletal: She exhibits no edema and no tenderness.  Neurological: She is alert. She has normal strength. No sensory deficit. Cranial nerve deficit:  no gross defecits noted. She exhibits normal muscle tone. She displays no seizure activity. Coordination normal.  Skin: Skin is warm and dry. No rash noted.  Psychiatric: She has a normal mood and affect.    ED Course  Procedures (including critical care time)  Labs Reviewed  CBC WITH DIFFERENTIAL - Abnormal; Notable for the following:    WBC 11.0 (*)     RBC 3.33 (*)     Hemoglobin 9.9 (*)     HCT 28.7 (*)     RDW 17.7 (*)     Monocytes Relative 16 (*)     Monocytes Absolute 1.8 (*)     All other components within normal limits  COMPREHENSIVE METABOLIC PANEL - Abnormal; Notable for the following:    Sodium 134 (*)     Albumin 3.0 (*)     All other components within normal limits  LIPASE, BLOOD  URINALYSIS, ROUTINE W REFLEX MICROSCOPIC   No results found. RADIOLOGY REPORT*  Clinical Data: Lupus. Shortness of breath on exertion for 8  months.  CT CHEST WITHOUT CONTRAST  Technique: Multidetector CT imaging of the chest was performed  following the standard protocol without IV contrast.Dedicated high  resolution images were also performed.  Comparison: None.  Findings: Lung windows demonstrate minimal biapical pleural  parenchymal scarring.  Minimal exclusion of the inferior 5 mm of the left lung base. No  evidence of interstitial lung disease. No airspace disease or  pulmonary nodules.  Soft tissue windows demonstrate small axillary nodes, without  adenopathy. Borderline cardiomegaly, accentuated by mild pectus  excavatum deformity. No pericardial or pleural effusion. No    mediastinal or definite hilar adenopathy, given limitations of  unenhanced CT. Minimal residual thymic tissue in the anterior  mediastinum.  Limited abdominal imaging demonstrates moderate to severe  cirrhosis. 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 Trace perihepatic ascites on  image 57 of series 5. Right upper quadrant nonspecific edema,  possibly related to cirrhosis.  No acute osseous abnormality.  High resolution images demonstrate no evidence of interstitial lung  disease. No micronodularity, subpleural reticulation,  honeycombing, or bronchiectasis. Expiratory images demonstrate no  significant air trapping.  IMPRESSION:  1. No pulmonary parenchymal explanation for shortness of breath.  No evidence of interstitial lung disease.  2. Borderline cardiomegaly, accentuated by a mild pectus excavatum  deformity.  3. Moderate to severe cirrhosis, with small volume perihepatic  ascites and possibly secondary nonspecific right upper quadrant  edema.  Original Report Authenticated By: Consuello Bossier, M.D.  From October 10  1. Flank pain       MDM  The patient's laboratory tests today unremarkable. She does have a slight leukocytosis but I do not feel that is significant. Her LFTs are unremarkable today. She does not have evidence to suggest pancreatitis. Her urine analysis is normal arguing against nephrolithiasis or UTI. The patient's recent CT scan does show moderate to severe cirrhosis which may be related to her abdominal discomfort. The patient will be discharged home        Celene Kras, MD 11/02/11 1159

## 2011-11-06 ENCOUNTER — Other Ambulatory Visit: Payer: Self-pay | Admitting: Chiropractor

## 2011-11-06 ENCOUNTER — Ambulatory Visit
Admission: RE | Admit: 2011-11-06 | Discharge: 2011-11-06 | Disposition: A | Discharge status: Home / Self Care | Payer: BC Managed Care – PPO | Source: Ambulatory Visit | Attending: Chiropractor | Admitting: Chiropractor

## 2011-11-06 DIAGNOSIS — M542 Cervicalgia: Secondary | ICD-10-CM

## 2011-11-12 ENCOUNTER — Telehealth: Payer: Self-pay | Admitting: Family Medicine

## 2011-11-12 ENCOUNTER — Ambulatory Visit (INDEPENDENT_AMBULATORY_CARE_PROVIDER_SITE_OTHER): Payer: BC Managed Care – PPO | Admitting: Internal Medicine

## 2011-11-12 ENCOUNTER — Encounter: Payer: Self-pay | Admitting: Internal Medicine

## 2011-11-12 VITALS — BP 130/80 | HR 84 | Temp 98.4°F | Wt 123.0 lb

## 2011-11-12 DIAGNOSIS — R109 Unspecified abdominal pain: Secondary | ICD-10-CM

## 2011-11-12 LAB — POCT URINALYSIS DIPSTICK
Bilirubin, UA: NEGATIVE
Ketones, UA: NEGATIVE
Leukocytes, UA: NEGATIVE
Protein, UA: NEGATIVE
Spec Grav, UA: 1.02

## 2011-11-12 NOTE — Telephone Encounter (Signed)
Caller: Myiesha/Patient; Patient Name: Sara Grimes; PCP: Lelon Perla.; Best Callback Phone Number: [phone].  Caller relates she has been having right lower back pain and chiropractor advised he felt it may be related to kidneys.  Denies new frequency or other urinary symptoms; relates she has chronic issues with frequency.  Afebrile  currently, but had chills a couple weeks ago. Pain rated at 6-8 of 10.  Relates she has not been taking OTC pain meds per recommendation from  Rheumatologist. Emergent symptoms ruled out.   Appointment with Dr. Drue Novel at 1415 per Flank Pain protocol.  Caller agreed.

## 2011-11-12 NOTE — Assessment & Plan Note (Addendum)
2 weeks history of right flank pain, urinalysis is negative Differential diagnoses include musculoskeletal pain, kidney stones, and other less commom conditions such as a kidney infarct with a history of lupus. Plan:  CT of the abdomen with and without. Denies need of  pain medication UCX

## 2011-11-12 NOTE — Telephone Encounter (Signed)
Apt scheduled with Dr.Paz at 4:15.     KP

## 2011-11-12 NOTE — Patient Instructions (Addendum)
We'll get the CT of the abdomen done call if the symptoms increase, you have fever chills

## 2011-11-12 NOTE — Progress Notes (Signed)
  Subjective:    Patient ID: Sara Grimes, female    DOB: [birth year], 56 y.o.   MRN: [id number]  HPI Acute visit 2 weeks history of a sudden onset of right flank pain, no radiation, the pain is on and off but never completely resolves. Is not worse with torso movement, may increase with cough or sneezing. She denies any injury or rash Went to the ER 11/02/2011, urinalysis was negative, BMP normal, LFTs normal, CBC normal except for chronic anemia, hemoglobin 9.9. At this point the patient continue with pain, it has been worsening the last 2 nights.  Past Medical History  Diagnosis Date  . Depression   . Primary biliary cirrhosis     PBC was r/o later on  . Portal hypertension   . Lupus   . 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 Mandible surgery 10/07    upper and lower   . Upper and lower jaw repair   . Liver biopsies    History   Social History  . Marital Status: Married    Spouse Name: N/A    Number of Children: N/A  . Years of Education: N/A   Occupational History  . Not on file.   Social History Main Topics  . Smoking status: Never Smoker   . Smokeless tobacco: Never Used  . Alcohol Use: Yes     socially  . Drug Use: No  . Sexually Active: Not on file   Other Topics Concern  . 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 SerSingleRegular exercise- yesDaily caffeine use    Review of Systems No fever or chills Denies any rash in the area. No dysuria gross hematuria No nausea, vomiting. No actual abdominal pain. She has no shortness of breath, does have a dry chronic cough for which she had a negative CT of the chest 10/03/2011 Denies any recent airplane trips, leg swelling or calf pain.    Objective:   Physical Exam General -- alert, well-developed, and well-nourished.   Neck --no LADs Lungs -- normal respiratory effort, no intercostal retractions, no accessory muscle use,  and normal breath sounds.   Heart-- normal rate, regular rhythm, no murmur, and no gallop.   Abdomen--soft, non-tender, no distention, no masses, no HSM, no guarding, and no rigidity.  Question of CVA tenderness on the right. Extremities-- no pretibial edema bilaterally Skin-- no rash at the flanks or abdomen Neurologic-- alert & oriented X3 and strength normal in all extremities. Psych-- Cognition and judgment appear intact. Alert and cooperative with normal attention span and concentration.  not anxious appearing and not depressed appearing.        Assessment & Plan:

## 2011-11-14 ENCOUNTER — Ambulatory Visit (HOSPITAL_BASED_OUTPATIENT_CLINIC_OR_DEPARTMENT_OTHER)
Admission: RE | Admit: 2011-11-14 | Discharge: 2011-11-14 | Disposition: A | Discharge status: Home / Self Care | Payer: BC Managed Care – PPO | Source: Ambulatory Visit | Attending: Internal Medicine | Admitting: Internal Medicine

## 2011-11-14 ENCOUNTER — Telehealth: Payer: Self-pay | Admitting: Internal Medicine

## 2011-11-14 DIAGNOSIS — R599 Enlarged lymph nodes, unspecified: Secondary | ICD-10-CM | POA: Insufficient documentation

## 2011-11-14 DIAGNOSIS — R109 Unspecified abdominal pain: Secondary | ICD-10-CM | POA: Insufficient documentation

## 2011-11-14 LAB — URINE CULTURE: Colony Count: 10000

## 2011-11-14 MED ORDER — IOHEXOL 300 MG/ML  SOLN
100.0000 mL | Freq: Once | INTRAMUSCULAR | Status: AC | PRN
  Administered 2011-11-14: 100 mL via INTRAVENOUS

## 2011-11-14 MED ORDER — CIPROFLOXACIN HCL 500 MG PO TABS: 500.0000 mg | ORAL_TABLET | Freq: Two times a day (BID) | ORAL | Status: DC

## 2011-11-14 NOTE — Telephone Encounter (Signed)
It shows that she had her CT done at 1230 & the status still says "exam begun" so you should see the results soon.

## 2011-11-14 NOTE — Telephone Encounter (Signed)
I discussed the CT report urology, there is a concern that this could be a renal abscess. I talked to the patient, : no fever chills nausea or vomiting. She extensive abnormalities as well around the pancreas, saw a number of periportal and retroperitoneal lymphadenopathies.  The patient reports extensive workup at [facility] for cirrhosis. Given lack of GI symptoms I doubt she is having acute pancreatitis. Plan: Start  Cipro 500 mg twice a day for 10 days and see Dr. Patsi Sears, her urologist. Patient in agreement Will try to get old CTs to compare with and see if the # of CT findings are chronic or not. Needs to see PCP next week for followup ER if fever, chills, nausea, vomiting. ----  1. Peripheral enhancing 2 cm lesion extending from the right mid  kidney is concerning for renal abscess. Renal neoplasm is felt  less likely. Recommend follow up imaging to demonstrate  resolution.  2. Extensive periportal and retroperitoneal lymphadenopathy. This  may relate to patient's history of lupus but cannot exclude a  lymphoproliferative disorder.  3. Extensive haziness within the mesentery and retroperitoneal  fat. No clear source of this inflammatory process which may also  relate to lupus. Would also consider pancreatitis.  4. Heterogeneous perfusion to the liver without discrete lesion.  A contrast MRI of the liver may be warranted to exclude underlying  lesion.  Original Report Authenticated By: Genevive Bi, M.D.

## 2011-11-14 NOTE — Telephone Encounter (Signed)
Advise patient, I haven't seen the result of the CT, is she planning to go forward w/ CT?

## 2011-11-17 ENCOUNTER — Telehealth: Payer: Self-pay | Admitting: Family Medicine

## 2011-11-17 NOTE — Telephone Encounter (Signed)
Message copied by Verner Chol on Mon Nov 17, 2011  4:56 PM ------      Message from: Arnette Norris      Created: Mon Nov 17, 2011  4:41 PM       Please offer this patient a follow up with Dr.Lowne              Thanks       KP      ----- Message -----         From: Lelon Perla, DO         Sent: 11/17/2011   4:16 PM           To: Arnette Norris, CMA             Pt needs f/u appointment      ----- Message -----         From: Wanda Plump, MD         Sent: 11/17/2011   4:07 PM           To: Lelon Perla, DO            Hi Myrene Buddy, I saw her last week, she only had flank pain, however her CT of the abdomen has a number of abnormalities, i think they are probably chronic findings but  I don't see a previous CT in the chart. I asked her to followup with you this week, could you be sure she sees you?      THX

## 2011-11-17 NOTE — Telephone Encounter (Signed)
will be in 10.30.13 at 11:30am

## 2011-11-18 ENCOUNTER — Telehealth: Payer: Self-pay | Admitting: Family Medicine

## 2011-11-18 NOTE — Telephone Encounter (Signed)
Message copied by Verner Chol on Tue Nov 18, 2011  7:38 AM ------      Message from: Arnette Norris      Created: Mon Nov 17, 2011  5:13 PM       Thank You.   KP      ----- Message -----         From: Theodis Sato McDaniels         Sent: 11/17/2011   4:55 PM           To: Arnette Norris, CMA            Due to her work sch., I put her in on Wednesday at 11:30am, she has meetings all day tomorrow but was available Wed., morning      Thanks            ----- Message -----         From: Arnette Norris, CMA         Sent: 11/17/2011   4:41 PM           To: Theodis Sato McDaniels            Please offer this patient a follow up with Dr.Lowne              Thanks       KP      ----- Message -----         From: Lelon Perla, DO         Sent: 11/17/2011   4:16 PM           To: Arnette Norris, CMA             Pt needs f/u appointment      ----- Message -----         From: Wanda Plump, MD         Sent: 11/17/2011   4:07 PM           To: Lelon Perla, DO            Hi Myrene Buddy, I saw her last week, she only had flank pain, however her CT of the abdomen has a number of abnormalities, i think they are probably chronic findings but  I don't see a previous CT in the chart. I asked her to followup with you this week, could you be sure she sees you?      THX

## 2011-11-19 ENCOUNTER — Encounter: Payer: Self-pay | Admitting: Family Medicine

## 2011-11-19 ENCOUNTER — Ambulatory Visit (INDEPENDENT_AMBULATORY_CARE_PROVIDER_SITE_OTHER): Payer: BC Managed Care – PPO | Admitting: Family Medicine

## 2011-11-19 VITALS — BP 110/76 | HR 72 | Temp 98.5°F | Wt 118.4 lb

## 2011-11-19 DIAGNOSIS — N151 Renal and perinephric abscess: Secondary | ICD-10-CM

## 2011-11-19 DIAGNOSIS — Z299 Encounter for prophylactic measures, unspecified: Secondary | ICD-10-CM

## 2011-11-19 MED ORDER — PNEUMOCOCCAL VAC POLYVALENT 25 MCG/0.5ML IJ INJ: 0.5000 mL | INJECTION | INTRAMUSCULAR | Status: DC

## 2011-11-19 NOTE — Progress Notes (Signed)
  Subjective:    Patient ID: Sara Grimes, female    DOB: [birth year], 56 y.o.   MRN: [id number]  HPI Pt here to go over CT.  Pt is on cipro for abscess and has an appointment with urology at the end of Nov.   Can arrange for repeat ct just before appointment.  No other complaints.    Review of Systems    as above Objective:   Physical Exam  Constitutional: She is oriented to person, place, and time. She appears well-developed and well-nourished.  Neurological: She is alert and oriented to person, place, and time.  Psychiatric: She has a normal mood and affect. Her behavior is normal. Judgment and thought content normal.          Assessment & Plan:

## 2011-11-20 DIAGNOSIS — N151 Renal and perinephric abscess: Secondary | ICD-10-CM | POA: Insufficient documentation

## 2011-11-20 NOTE — Assessment & Plan Note (Signed)
Sara Grimes F/u urology Repeat ct

## 2011-12-08 ENCOUNTER — Other Ambulatory Visit (HOSPITAL_BASED_OUTPATIENT_CLINIC_OR_DEPARTMENT_OTHER): Payer: BC Managed Care – PPO

## 2011-12-10 ENCOUNTER — Ambulatory Visit (HOSPITAL_BASED_OUTPATIENT_CLINIC_OR_DEPARTMENT_OTHER)
Admission: RE | Admit: 2011-12-10 | Discharge: 2011-12-10 | Disposition: A | Discharge status: Home / Self Care | Payer: BC Managed Care – PPO | Source: Ambulatory Visit | Attending: Family Medicine | Admitting: Family Medicine

## 2011-12-10 ENCOUNTER — Other Ambulatory Visit: Payer: Self-pay | Admitting: Family Medicine

## 2011-12-10 DIAGNOSIS — Z1231 Encounter for screening mammogram for malignant neoplasm of breast: Secondary | ICD-10-CM

## 2011-12-10 DIAGNOSIS — N151 Renal and perinephric abscess: Secondary | ICD-10-CM

## 2011-12-10 MED ORDER — IOHEXOL 300 MG/ML  SOLN
100.0000 mL | Freq: Once | INTRAMUSCULAR | Status: AC | PRN
  Administered 2011-12-10: 100 mL via INTRAVENOUS

## 2011-12-19 ENCOUNTER — Ambulatory Visit (HOSPITAL_BASED_OUTPATIENT_CLINIC_OR_DEPARTMENT_OTHER)
Admission: RE | Admit: 2011-12-19 | Discharge: 2011-12-19 | Disposition: A | Discharge status: Home / Self Care | Payer: BC Managed Care – PPO | Source: Ambulatory Visit | Attending: Family Medicine | Admitting: Family Medicine

## 2011-12-19 DIAGNOSIS — Z1231 Encounter for screening mammogram for malignant neoplasm of breast: Secondary | ICD-10-CM

## 2011-12-22 ENCOUNTER — Inpatient Hospital Stay (HOSPITAL_BASED_OUTPATIENT_CLINIC_OR_DEPARTMENT_OTHER): Admission: RE | Admit: 2011-12-22 | Payer: BC Managed Care – PPO | Source: Ambulatory Visit

## 2012-07-19 ENCOUNTER — Ambulatory Visit: Payer: BC Managed Care – PPO | Admitting: Family Medicine

## 2012-11-01 ENCOUNTER — Ambulatory Visit (INDEPENDENT_AMBULATORY_CARE_PROVIDER_SITE_OTHER): Payer: 59 | Admitting: Family Medicine

## 2012-11-01 ENCOUNTER — Telehealth: Payer: Self-pay | Admitting: *Deleted

## 2012-11-01 ENCOUNTER — Encounter: Payer: Self-pay | Admitting: Family Medicine

## 2012-11-01 VITALS — BP 124/70 | HR 102 | Temp 98.1°F | Wt 130.0 lb

## 2012-11-01 DIAGNOSIS — K219 Gastro-esophageal reflux disease without esophagitis: Secondary | ICD-10-CM

## 2012-11-01 DIAGNOSIS — N390 Urinary tract infection, site not specified: Secondary | ICD-10-CM

## 2012-11-01 DIAGNOSIS — R109 Unspecified abdominal pain: Secondary | ICD-10-CM

## 2012-11-01 MED ORDER — DEXLANSOPRAZOLE 60 MG PO CPDR: 60.0000 mg | DELAYED_RELEASE_CAPSULE | Freq: Every day | ORAL | Status: DC

## 2012-11-01 NOTE — Patient Instructions (Signed)

## 2012-11-01 NOTE — Telephone Encounter (Signed)
Pt called and stated that she has real bad stomach pain and that her stomach feels hard. Pt states that she would like to make an appointment to see Dr. Laury Axon for this afternoon. Appointment scheduled.

## 2012-11-01 NOTE — Progress Notes (Signed)
  Subjective:     Sara Grimes is a 57 y.o. female who presents for evaluation of abdominal pain. Onset was several days ago. Symptoms have been gradually worsening. The pain is described as cramping and pressure-like. . Pain is located in the epigastric region without radiation. + bloating in entire abd.  Aggravating factors: eating.  Alleviating factors: none. Associated symptoms: none. The patient denies anorexia, arthralagias, belching, chills, constipation, diarrhea, dysuria, fever, flatus, frequency, headache, hematochezia, hematuria, melena, myalgias, nausea, sweats and vomiting.  The patient's history has been marked as reviewed and updated as appropriate.  Review of Systems Pertinent items are noted in HPI.     Objective:    BP 124/70  Pulse 102  Temp(Src) 98.1 F (36.7 C) (Oral)  Wt 130 lb (58.968 kg)  BMI 23.03 kg/m2  SpO2 99% General appearance: alert, cooperative, appears stated age and no distress Neck: no adenopathy, supple, symmetrical, trachea midline and thyroid not enlarged, symmetric, no tenderness/mass/nodules Lungs: clear to auscultation bilaterally Heart: S1, S2 normal Abdomen: abnormal findings:  distended and moderate tenderness in the epigastrium  no guarding , no rebound   Assessment:    Abdominal pain,---- ? gerd .    Plan:    The diagnosis was discussed with the patient and evaluation and treatment plans outlined. See orders for lab and imaging studies. Adhere to simple, bland diet. Initiate empiric trial of acid suppression; see orders. Further follow-up plans will be based on outcome of lab/imaging studies; see orders. Follow up as needed. refer to gi

## 2012-11-02 ENCOUNTER — Ambulatory Visit (HOSPITAL_BASED_OUTPATIENT_CLINIC_OR_DEPARTMENT_OTHER): Payer: 59

## 2012-11-02 ENCOUNTER — Encounter: Payer: Self-pay | Admitting: Gastroenterology

## 2012-11-02 ENCOUNTER — Ambulatory Visit (HOSPITAL_BASED_OUTPATIENT_CLINIC_OR_DEPARTMENT_OTHER)
Admission: RE | Admit: 2012-11-02 | Discharge: 2012-11-02 | Disposition: A | Discharge status: Home / Self Care | Payer: 59 | Source: Ambulatory Visit | Attending: Family Medicine | Admitting: Family Medicine

## 2012-11-02 DIAGNOSIS — K7460 Unspecified cirrhosis of liver: Secondary | ICD-10-CM | POA: Insufficient documentation

## 2012-11-02 DIAGNOSIS — R1013 Epigastric pain: Secondary | ICD-10-CM | POA: Insufficient documentation

## 2012-11-02 DIAGNOSIS — R188 Other ascites: Secondary | ICD-10-CM | POA: Insufficient documentation

## 2012-11-02 DIAGNOSIS — R109 Unspecified abdominal pain: Secondary | ICD-10-CM

## 2012-11-02 LAB — CBC WITH DIFFERENTIAL/PLATELET
Basophils Relative: 1 % (ref 0.0–3.0)
Eosinophils Absolute: 0.2 10*3/uL (ref 0.0–0.7)
Hemoglobin: 9.4 g/dL — ABNORMAL LOW (ref 12.0–15.0)
Lymphocytes Relative: 32.2 % (ref 12.0–46.0)
MCHC: 33.8 g/dL (ref 30.0–36.0)
Monocytes Relative: 16 % — ABNORMAL HIGH (ref 3.0–12.0)
Neutro Abs: 4.1 10*3/uL (ref 1.4–7.7)
Neutrophils Relative %: 48.5 % (ref 43.0–77.0)
RBC: 3.07 Mil/uL — ABNORMAL LOW (ref 3.87–5.11)
WBC: 8.5 10*3/uL (ref 4.5–10.5)

## 2012-11-02 LAB — HEPATIC FUNCTION PANEL
ALT: 25 U/L (ref 0–35)
Alkaline Phosphatase: 99 U/L (ref 39–117)
Bilirubin, Direct: 1.4 mg/dL — ABNORMAL HIGH (ref 0.0–0.3)
Total Bilirubin: 2.7 mg/dL — ABNORMAL HIGH (ref 0.3–1.2)
Total Protein: 6.3 g/dL (ref 6.0–8.3)

## 2012-11-02 LAB — BASIC METABOLIC PANEL
Calcium: 8.4 mg/dL (ref 8.4–10.5)
Creatinine, Ser: 0.5 mg/dL (ref 0.4–1.2)
GFR: 153.27 mL/min (ref >60.00)
Sodium: 138 mEq/L (ref 135–145)

## 2012-11-02 LAB — LIPASE: Lipase: 34 U/L (ref 11.0–59.0)

## 2012-11-03 ENCOUNTER — Telehealth: Payer: Self-pay | Admitting: *Deleted

## 2012-11-03 LAB — URINE CULTURE: Colony Count: 2000

## 2012-11-03 NOTE — Telephone Encounter (Signed)
We need to see if we can get her into GI sooner.    She should call her GI at [facility] to see if she can get in there as well.

## 2012-11-03 NOTE — Telephone Encounter (Signed)
Pt called and stated that her stomach is still very bloated and her stomach is very tender. Pt also states that is stomach hurts at time but she can deal with it. Pt states her stomach "looks like she is 6 months pregnant". Would like to know what can be done? Please advise SW

## 2012-11-03 NOTE — Telephone Encounter (Signed)
MSG left to make the patient aware to call [facility]. I will forward to [place] for referral status.      KP

## 2012-11-03 NOTE — Telephone Encounter (Signed)
[facility] said they would schedule her, but they will not see her if she is seeing GI @ [facility]. I spoke w/pt and she states they are only treating her for her liver. To avoid complications and dual treatment, it may be better for patient to see someone at [facility].

## 2012-11-03 NOTE — Telephone Encounter (Signed)
Notes Recorded by Lelon Perla, DO on 11/02/2012 at 3:47 PM I can't access Care everywhere for some reason to compare. Referral to GI pending-----we can send to GI and her Hep C Dr at [facility] + cirrhosis of the liver

## 2012-11-04 ENCOUNTER — Encounter: Payer: Self-pay | Admitting: Family Medicine

## 2012-11-04 ENCOUNTER — Other Ambulatory Visit: Payer: Self-pay | Admitting: Gastroenterology

## 2012-11-04 ENCOUNTER — Other Ambulatory Visit: Payer: Self-pay | Admitting: *Deleted

## 2012-11-04 ENCOUNTER — Encounter: Payer: Self-pay | Admitting: *Deleted

## 2012-11-04 NOTE — Telephone Encounter (Signed)
Per Dr.Lowne if patient is getting worst she needs to go to the ED and if not we can order a CT scan of the abdomen. I tried calling the patient but there was no ans.    KP

## 2012-11-04 NOTE — Telephone Encounter (Signed)
Patient called and wanted results for her [place] that she had done. Pt was given results and felt like something else was wrong. She stated that it was very unusual for her stomach to be swelling the way that it is. Pt was advised that if things are getting worse than she needs to go to the ER. Pt states that she will probably go to the ER because "something isn't right".    SW, CMA

## 2012-11-05 ENCOUNTER — Ambulatory Visit: Payer: 59 | Admitting: Gastroenterology

## 2012-11-16 ENCOUNTER — Ambulatory Visit: Payer: 59 | Admitting: Gastroenterology

## 2012-11-25 ENCOUNTER — Other Ambulatory Visit: Payer: Self-pay

## 2013-05-24 ENCOUNTER — Ambulatory Visit (HOSPITAL_BASED_OUTPATIENT_CLINIC_OR_DEPARTMENT_OTHER)
Admission: RE | Admit: 2013-05-24 | Discharge: 2013-05-24 | Disposition: A | Discharge status: Home / Self Care | Payer: 59 | Source: Ambulatory Visit | Attending: Family Medicine | Admitting: Family Medicine

## 2013-05-24 ENCOUNTER — Ambulatory Visit (INDEPENDENT_AMBULATORY_CARE_PROVIDER_SITE_OTHER): Payer: 59 | Admitting: Family Medicine

## 2013-05-24 ENCOUNTER — Encounter: Payer: Self-pay | Admitting: Family Medicine

## 2013-05-24 VITALS — BP 116/72 | HR 84 | Temp 98.7°F | Wt 114.0 lb

## 2013-05-24 DIAGNOSIS — J209 Acute bronchitis, unspecified: Secondary | ICD-10-CM

## 2013-05-24 DIAGNOSIS — I700 Atherosclerosis of aorta: Secondary | ICD-10-CM | POA: Insufficient documentation

## 2013-05-24 DIAGNOSIS — R05 Cough: Secondary | ICD-10-CM | POA: Insufficient documentation

## 2013-05-24 DIAGNOSIS — R059 Cough, unspecified: Secondary | ICD-10-CM | POA: Insufficient documentation

## 2013-05-24 MED ORDER — GUAIFENESIN-CODEINE 100-10 MG/5ML PO SYRP: ORAL_SOLUTION | ORAL | Status: AC

## 2013-05-24 MED ORDER — AZITHROMYCIN 250 MG PO TABS: ORAL_TABLET | ORAL | Status: AC

## 2013-05-24 NOTE — Progress Notes (Signed)
Pre visit review using our clinic review tool, if applicable. No additional management support is needed unless otherwise documented below in the visit note. 

## 2013-05-24 NOTE — Patient Instructions (Signed)

## 2013-05-26 ENCOUNTER — Encounter: Payer: Self-pay | Admitting: Family Medicine

## 2013-05-26 NOTE — Progress Notes (Signed)
  Subjective:     Sara Grimes is a 58 y.o. female here for evaluation of a cough. Onset of symptoms was 1 month ago. Symptoms have been gradually worsening since that time. The cough is productive and is aggravated by infection, pollens and reclining position. Associated symptoms include: shortness of breath, sputum production and wheezing. Patient does not have a history of asthma. Patient does not have a history of environmental allergens. Patient has not traveled recently. Patient does not have a history of smoking. Patient has not had a previous chest x-ray. Patient has not had a PPD done.  The following portions of the patient's history were reviewed and updated as appropriate: allergies, current medications, past family history, past medical history, past social history, past surgical history and problem list.  Review of Systems Pertinent items are noted in HPI.    Objective:    Oxygen saturation 97% on room air BP 116/72  Pulse 84  Temp(Src) 98.7 F (37.1 C) (Oral)  Wt 114 lb (51.71 kg)  SpO2 97% General appearance: alert, cooperative, appears stated age and no distress Head: Normocephalic, without obvious abnormality, atraumatic Ears: normal TM's and external ear canals both ears Nose: Nares normal. Septum midline. Mucosa normal. No drainage or sinus tenderness. Throat: lips, mucosa, and tongue normal; teeth and gums normal Neck: no adenopathy, supple, symmetrical, trachea midline and thyroid not enlarged, symmetric, no tenderness/mass/nodules Lungs: rhonchi bilaterally and wheezes bilaterally Heart: S1, S2 normal Extremities: extremities normal, atraumatic, no cyanosis or edema    Assessment:    Acute Bronchitis    Plan:    Antibiotics per medication orders. Antitussives per medication orders. Avoid exposure to tobacco smoke and fumes. B-agonist inhaler. Call if shortness of breath worsens, blood in sputum, change in character of cough, development of fever or chills,  inability to maintain nutrition and hydration. Avoid exposure to tobacco smoke and fumes. Chest x-ray.

## 2013-06-06 ENCOUNTER — Telehealth: Payer: Self-pay | Admitting: Family Medicine

## 2013-06-06 NOTE — Telephone Encounter (Signed)
A user error has taken place.

## 2013-12-02 ENCOUNTER — Telehealth: Payer: Self-pay

## 2013-12-02 NOTE — Telephone Encounter (Signed)
Admit date:  11/21/13 Discharge date:  11/25/13 [facility]    Left message for call back.

## 2013-12-06 NOTE — Telephone Encounter (Signed)
Caller name:Pursifull, Amerah J Relation to pt: self  Call back [phone]   Reason for call:  Pt returned your call, pt states she is currently in the hospital

## 2013-12-06 NOTE — Telephone Encounter (Signed)
Pt currently in the hospital.  The plan is for her to be discharged this Thursday.  A hospital follow up appointment has been scheduled for 12/19/13 @ 11:30 am.  Will call back later in week to complete TCM call.

## 2013-12-08 NOTE — Telephone Encounter (Signed)
Left a message for call back.  

## 2013-12-19 ENCOUNTER — Ambulatory Visit: Payer: 59 | Admitting: Family Medicine

## 2013-12-19 NOTE — Telephone Encounter (Signed)
Call from mother and she wanted to know if the patient has had a TDAP, I made her aware that due to HIPAA, I would not be able to release the information. I called Husband Lorin PicketScott and advised the TDAP has not been done, she only has Hep A and B documented on her chart. He voiced understanding. The patient is having a procedure done in the morning and the hospital needed the information.     KP

## 2013-12-19 NOTE — Telephone Encounter (Signed)
Noted--do not charge

## 2013-12-19 NOTE — Telephone Encounter (Signed)
Mother called back and said she needed the records of her Immunization emailed so [facility] can [facility], I made her aware that they can check care everywhere to access her records because we are on the same system.      KP

## 2013-12-19 NOTE — Telephone Encounter (Signed)
Pt is still in hospital, will not attend todays appt

## 2014-01-17 ENCOUNTER — Encounter: Payer: Self-pay | Admitting: Family Medicine

## 2014-02-20 DEATH — deceased

## 2015-05-12 IMAGING — US US ABDOMEN COMPLETE
1 series · 14 of 25 positions shown · non-contrast
Comparison: CT 12/10/2011.

CLINICAL DATA: Abdominal bloating, epigastric pain.

EXAM:
ULTRASOUND ABDOMEN COMPLETE

[Series 1: us abdomen complete · 0.30mm/px · 14 of 75 slices shown]
[im 1/75]
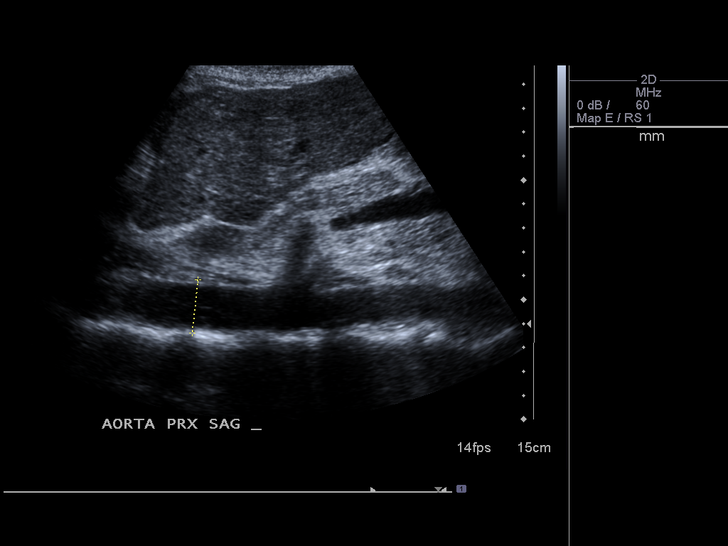
[im 7/75]
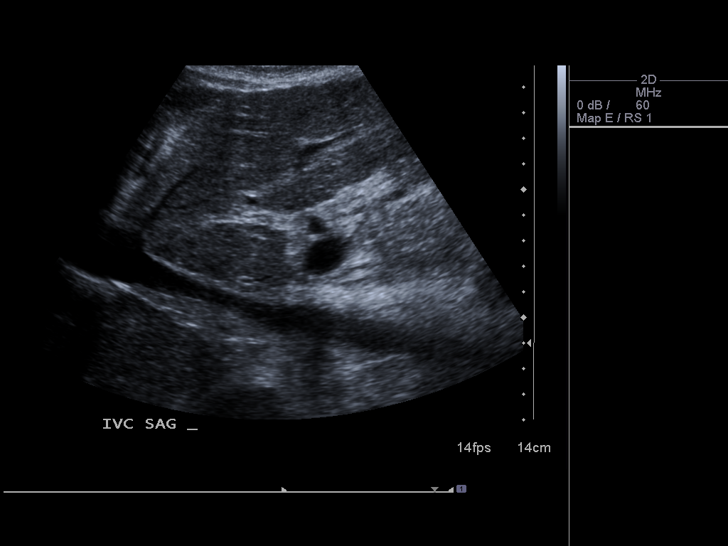
[im 13/75]
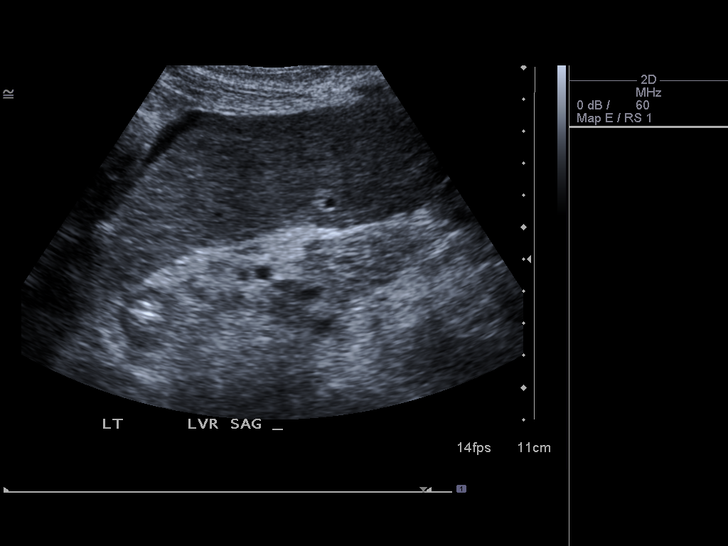
[im 19/75]
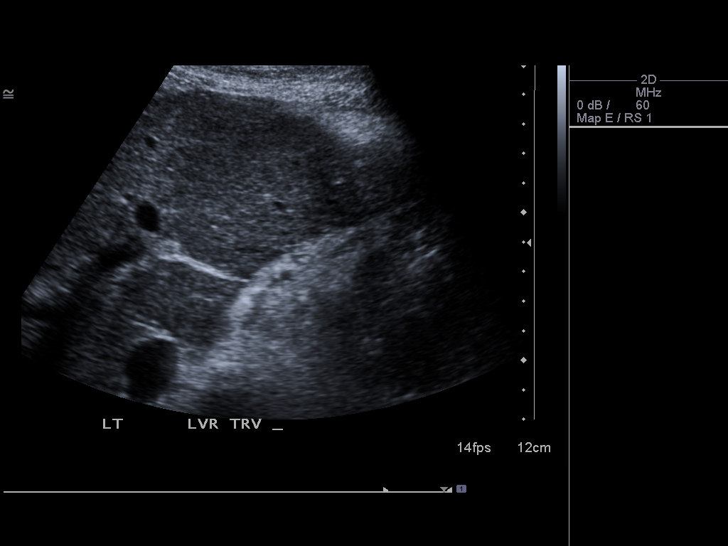
[im 25/75]
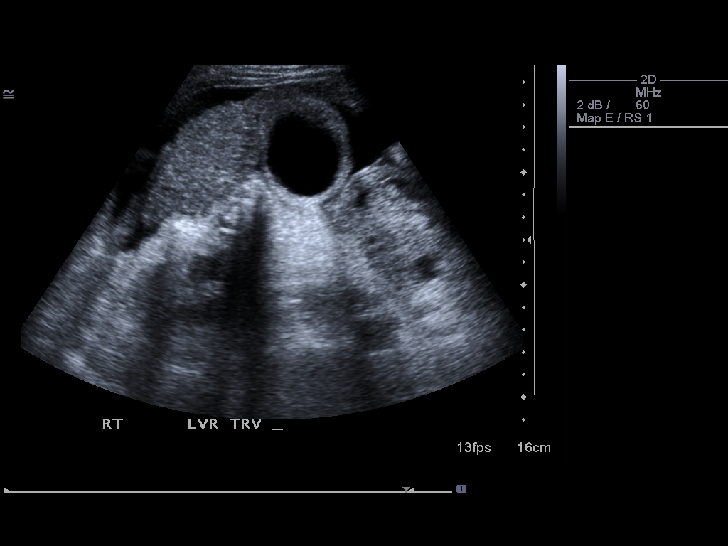
[im 28/75]
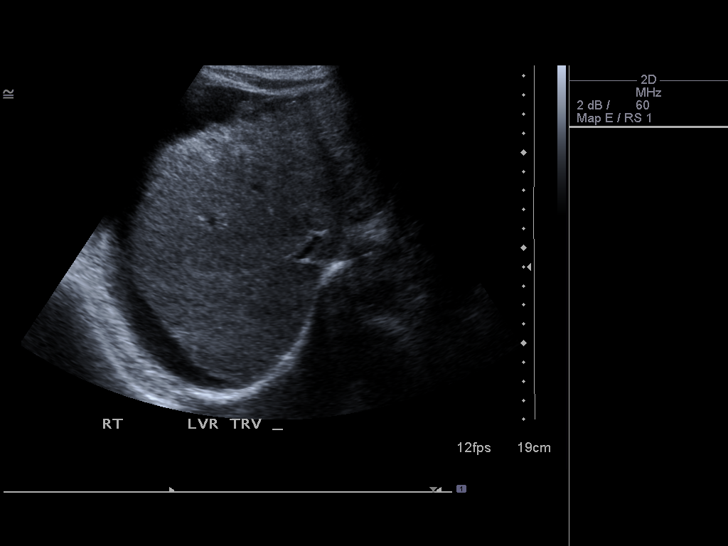
[im 34/75]
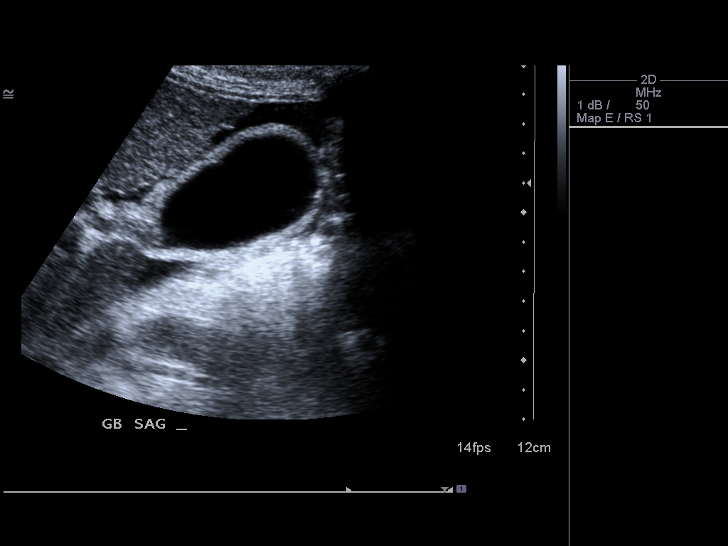
[im 41/75]
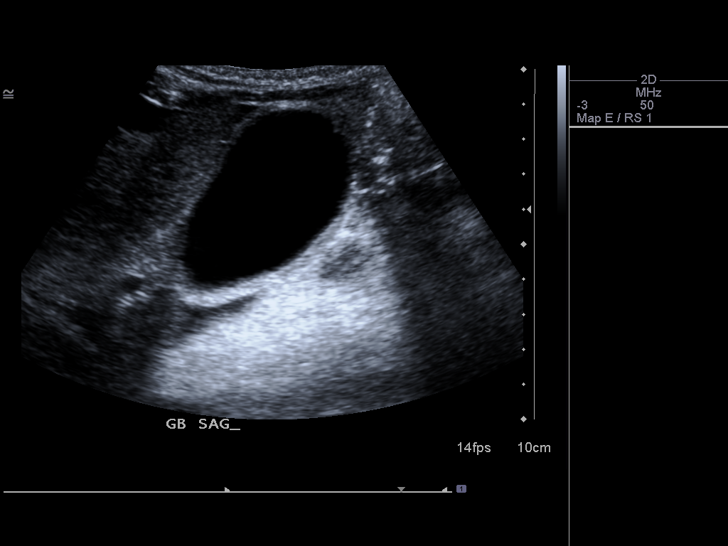
[im 47/75]
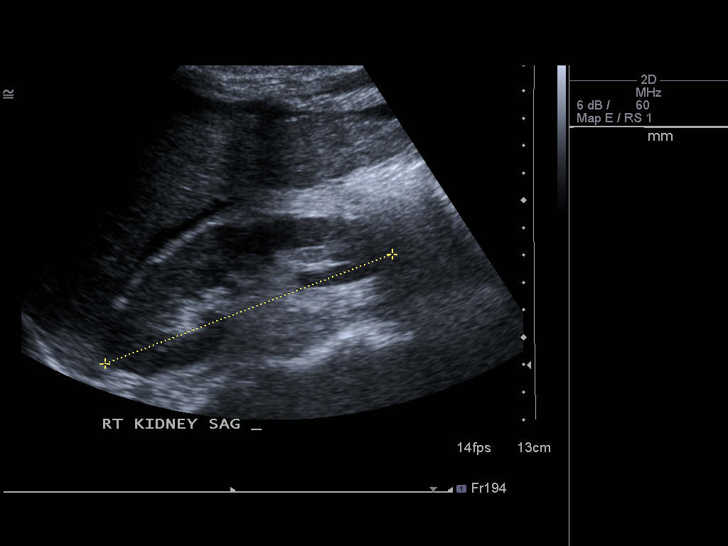
[im 50/75]
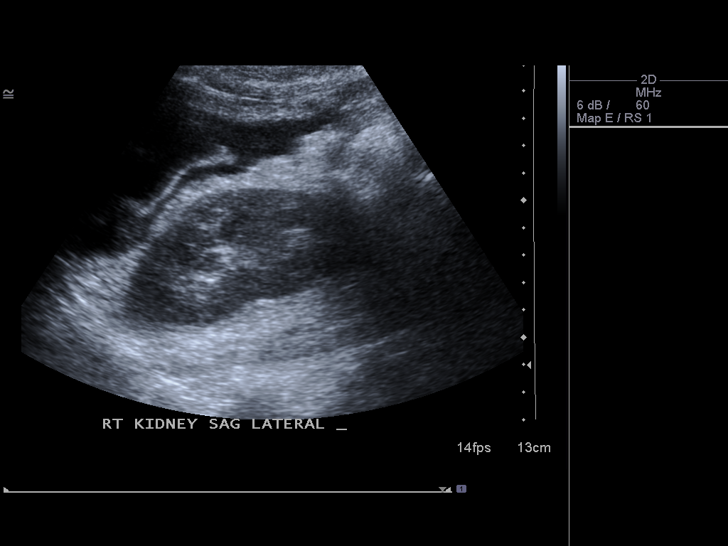
[im 56/75]
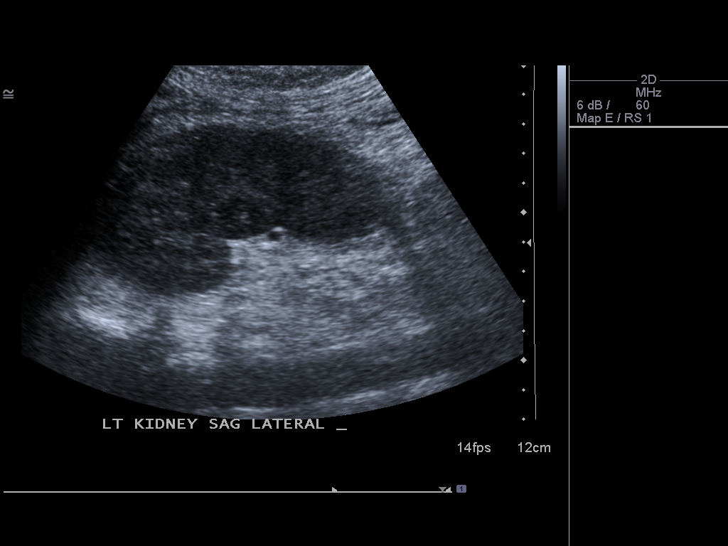
[im 62/75]
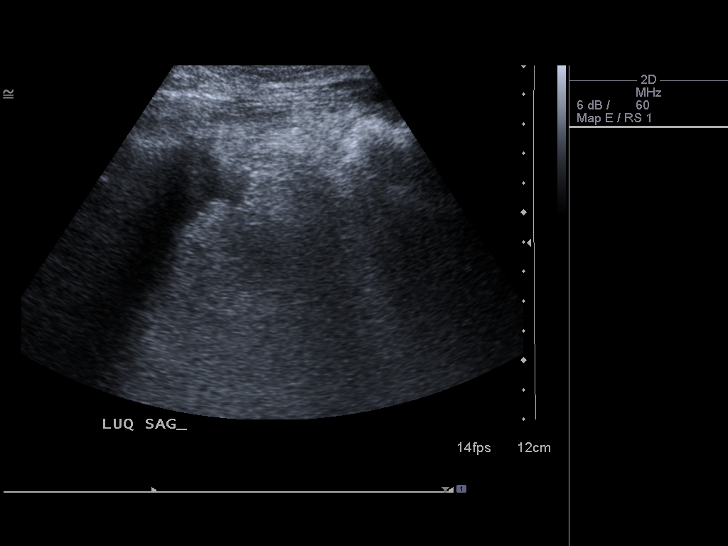
[im 68/75]
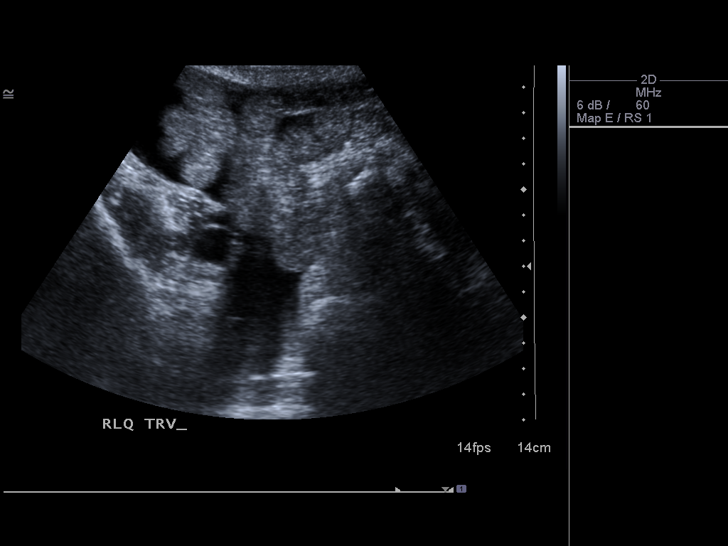
[im 75/75]
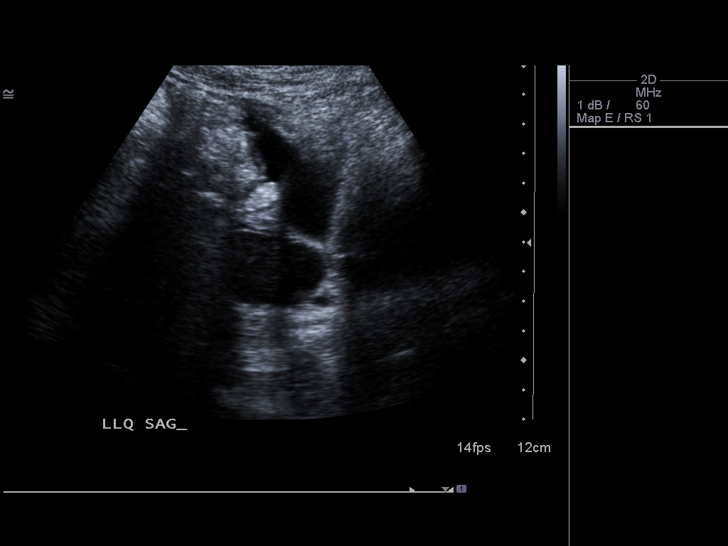

[14 of 25 positions shown; findings below may reference images not displayed]

FINDINGS: Gallbladder

No visible stones. Gallbladder wall is borderline thickened at 4 mm.
This may be related to liver disease. Negative sonographic Miron.

Common bile duct

Diameter: Normal caliber, 4 mm.

Liver

Nodular contours with coarsened echotexture compatible with
cirrhosis. No visible focal abnormality.

IVC

No abnormality visualized.

Pancreas

Visualized portion unremarkable.

Spleen

Not visualized.

Right Kidney

Length: 11.2 cm. Echogenicity within normal limits. No mass or
hydronephrosis visualized.

Left Kidney

Length: 11.0 cm. Echogenicity within normal limits. No mass or
hydronephrosis visualized.

Abdominal aorta

No aneurysm visualized.

Moderate ascites throughout all 4 quadrants of the abdomen and
pelvis.
IMPRESSION: Changes of cirrhosis. Associated moderate ascites.

Gallbladder wall thickening, likely related to liver disease. No
visible stones. Negative sonographic Miron.
